# Patient Record
Sex: Female | Born: 1995 | Race: Black or African American | Hispanic: No | Marital: Single | Smoking: Never smoker
Health system: Southern US, Community
[De-identification: ages and names within clinical notes are randomized; demographics above are authoritative.]

## PROBLEM LIST (undated history)

## (undated) DIAGNOSIS — T7840XA Allergy, unspecified, initial encounter: Secondary | ICD-10-CM

## (undated) DIAGNOSIS — Z8489 Family history of other specified conditions: Secondary | ICD-10-CM

## (undated) DIAGNOSIS — R519 Headache, unspecified: Secondary | ICD-10-CM

## (undated) DIAGNOSIS — R51 Headache: Secondary | ICD-10-CM

## (undated) DIAGNOSIS — M21611 Bunion of right foot: Secondary | ICD-10-CM

## (undated) DIAGNOSIS — Z87442 Personal history of urinary calculi: Secondary | ICD-10-CM

## (undated) DIAGNOSIS — G43909 Migraine, unspecified, not intractable, without status migrainosus: Secondary | ICD-10-CM

## (undated) DIAGNOSIS — B019 Varicella without complication: Secondary | ICD-10-CM

## (undated) DIAGNOSIS — F419 Anxiety disorder, unspecified: Secondary | ICD-10-CM

## (undated) DIAGNOSIS — F329 Major depressive disorder, single episode, unspecified: Secondary | ICD-10-CM

## (undated) DIAGNOSIS — F32A Depression, unspecified: Secondary | ICD-10-CM

## (undated) HISTORY — DX: Major depressive disorder, single episode, unspecified: F32.9

## (undated) HISTORY — DX: Anxiety disorder, unspecified: F41.9

## (undated) HISTORY — DX: Allergy, unspecified, initial encounter: T78.40XA

## (undated) HISTORY — PX: SMALL INTESTINE SURGERY: SHX150

## (undated) HISTORY — DX: Depression, unspecified: F32.A

## (undated) HISTORY — DX: Headache, unspecified: R51.9

## (undated) HISTORY — DX: Varicella without complication: B01.9

## (undated) HISTORY — DX: Headache: R51

---

## 2015-04-02 DIAGNOSIS — Z975 Presence of (intrauterine) contraceptive device: Secondary | ICD-10-CM | POA: Insufficient documentation

## 2016-05-07 ENCOUNTER — Emergency Department (HOSPITAL_COMMUNITY): Payer: BC Managed Care – PPO

## 2016-05-07 ENCOUNTER — Encounter (HOSPITAL_COMMUNITY): Payer: Self-pay | Admitting: Radiology

## 2016-05-07 ENCOUNTER — Emergency Department (HOSPITAL_COMMUNITY)
Admission: EM | Admit: 2016-05-07 | Discharge: 2016-05-07 | Disposition: A | Discharge status: Home / Self Care | Payer: BC Managed Care – PPO | Attending: Emergency Medicine | Admitting: Emergency Medicine

## 2016-05-07 DIAGNOSIS — N1330 Unspecified hydronephrosis: Secondary | ICD-10-CM

## 2016-05-07 DIAGNOSIS — N132 Hydronephrosis with renal and ureteral calculous obstruction: Secondary | ICD-10-CM | POA: Diagnosis not present

## 2016-05-07 DIAGNOSIS — R109 Unspecified abdominal pain: Secondary | ICD-10-CM | POA: Diagnosis present

## 2016-05-07 DIAGNOSIS — N200 Calculus of kidney: Secondary | ICD-10-CM

## 2016-05-07 LAB — COMPREHENSIVE METABOLIC PANEL
ALT: 17 U/L (ref 14–54)
AST: 31 U/L (ref 15–41)
Albumin: 4.7 g/dL (ref 3.5–5.0)
Alkaline Phosphatase: 53 U/L (ref 38–126)
Anion gap: 12 (ref 5–15)
BILIRUBIN TOTAL: 0.4 mg/dL (ref 0.3–1.2)
BUN: 9 mg/dL (ref 6–20)
CALCIUM: 9.4 mg/dL (ref 8.9–10.3)
CO2: 24 mmol/L (ref 22–32)
CREATININE: 0.78 mg/dL (ref 0.44–1.00)
Chloride: 103 mmol/L (ref 101–111)
GFR calc Af Amer: >60 mL/min (ref >60)
Glucose, Bld: 96 mg/dL (ref 65–99)
Potassium: 3.2 mmol/L — ABNORMAL LOW (ref 3.5–5.1)
Sodium: 139 mmol/L (ref 135–145)
TOTAL PROTEIN: 7.5 g/dL (ref 6.5–8.1)

## 2016-05-07 LAB — URINALYSIS, ROUTINE W REFLEX MICROSCOPIC
BILIRUBIN URINE: NEGATIVE
GLUCOSE, UA: NEGATIVE mg/dL
Ketones, ur: NEGATIVE mg/dL
LEUKOCYTES UA: NEGATIVE
Nitrite: NEGATIVE
PH: 5 (ref 5.0–8.0)
Protein, ur: 100 mg/dL — AB
SPECIFIC GRAVITY, URINE: 1.02 (ref 1.005–1.030)

## 2016-05-07 LAB — CBC
HEMATOCRIT: 37 % (ref 36.0–46.0)
Hemoglobin: 12.7 g/dL (ref 12.0–15.0)
MCH: 30.2 pg (ref 26.0–34.0)
MCHC: 34.3 g/dL (ref 30.0–36.0)
MCV: 88.1 fL (ref 78.0–100.0)
Platelets: 230 10*3/uL (ref 150–400)
RBC: 4.2 MIL/uL (ref 3.87–5.11)
RDW: 12.4 % (ref 11.5–15.5)
WBC: 8 10*3/uL (ref 4.0–10.5)

## 2016-05-07 LAB — POC URINE PREG, ED: Preg Test, Ur: NEGATIVE

## 2016-05-07 MED ORDER — KETOROLAC TROMETHAMINE 30 MG/ML IJ SOLN
30.0000 mg | Freq: Once | INTRAMUSCULAR | Status: AC
  Administered 2016-05-07: 30 mg via INTRAVENOUS
  Filled 2016-05-07: qty 1

## 2016-05-07 MED ORDER — MORPHINE SULFATE (PF) 4 MG/ML IV SOLN
2.0000 mg | Freq: Once | INTRAVENOUS | Status: AC
  Administered 2016-05-07: 2 mg via INTRAVENOUS
  Filled 2016-05-07: qty 1

## 2016-05-07 MED ORDER — SODIUM CHLORIDE 0.9 % IV BOLUS (SEPSIS)
1000.0000 mL | Freq: Once | INTRAVENOUS | Status: AC
  Administered 2016-05-07: 1000 mL via INTRAVENOUS

## 2016-05-07 MED ORDER — POTASSIUM CHLORIDE CRYS ER 20 MEQ PO TBCR
40.0000 meq | EXTENDED_RELEASE_TABLET | Freq: Once | ORAL | Status: AC
  Administered 2016-05-07: 40 meq via ORAL
  Filled 2016-05-07: qty 2

## 2016-05-07 MED ORDER — IBUPROFEN 600 MG PO TABS: 600.0000 mg | ORAL_TABLET | Freq: Four times a day (QID) | ORAL | 0 refills | Status: DC | PRN

## 2016-05-07 MED ORDER — OXYCODONE-ACETAMINOPHEN 5-325 MG PO TABS: 1.0000 | ORAL_TABLET | Freq: Four times a day (QID) | ORAL | 0 refills | Status: DC | PRN

## 2016-05-07 MED ORDER — ONDANSETRON HCL 4 MG/2ML IJ SOLN
4.0000 mg | Freq: Once | INTRAMUSCULAR | Status: AC
  Administered 2016-05-07: 4 mg via INTRAVENOUS
  Filled 2016-05-07: qty 2

## 2016-05-07 NOTE — ED Notes (Signed)
Bladder Scan: 

## 2016-05-07 NOTE — ED Notes (Signed)
Patient transported to CT 

## 2016-05-07 NOTE — ED Provider Notes (Signed)
[facility]-EMERGENCY DEPT Provider Note   CSN: [id number] Arrival date & time: 05/07/16  4132     History   Chief Complaint Chief Complaint  Patient presents with  . Flank Pain  . Nausea  . Emesis    HPI Brenda Hutchinson is a 21 y.o. female.  HPI  21 y.o. female, presents to the Emergency Department today via EMS complaining of left sided flank pain x 2 days. Notes hx same with kidney infection. Pt states pain improved yesterday around 3pm, but returned this morning. Associated N/V x 1 this AM. Rates pain 9/10 on left flank. No diarrhea. No abdominal pain. No fevers. No dysuria. Able to tolerate PO. No CP/SOB. No meds PTA. No other symptoms noted.   No past medical history on file.  There are no active problems to display for this patient.   No past surgical history on file.  OB History    No data available       Home Medications    Prior to Admission medications   Not on File    Family History No family history on file.  Social History Social History  Substance Use Topics  . Smoking status: Not on file  . Smokeless tobacco: Not on file  . Alcohol use Not on file     Allergies   Patient has no allergy information on record.   Review of Systems Review of Systems ROS reviewed and all are negative for acute change except as noted in the HPI.  Physical Exam Updated Vital Signs BP 106/71   Pulse 89   Temp 98.5 F (36.9 C)   Resp 18   LMP 03/23/2016 (Approximate) Comment: irregular periods,neg preg 05-07-16  SpO2 97%   Physical Exam  Constitutional: She is oriented to person, place, and time. Vital signs are normal. She appears well-developed and well-nourished.  HENT:  Head: Normocephalic and atraumatic.  Right Ear: Hearing normal.  Left Ear: Hearing normal.  Eyes: Conjunctivae and EOM are normal. Pupils are equal, round, and reactive to light.  Neck: Normal range of motion. Neck supple.  Cardiovascular: Normal rate, regular rhythm, normal heart  sounds and intact distal pulses.   Pulmonary/Chest: Effort normal and breath sounds normal.  Abdominal: Soft. Bowel sounds are normal. There is no tenderness. There is CVA tenderness (left and right). There is no rigidity, no rebound, no guarding, no tenderness at McBurney's point and negative Murphy's sign.  Musculoskeletal: Normal range of motion.  Neurological: She is alert and oriented to person, place, and time.  Skin: Skin is warm and dry.  Psychiatric: She has a normal mood and affect. Her speech is normal and behavior is normal. Thought content normal.  Nursing note and vitals reviewed.  ED Treatments / Results  Labs (all labs ordered are listed, but only abnormal results are displayed) Labs Reviewed  URINALYSIS, ROUTINE W REFLEX MICROSCOPIC - Abnormal; Notable for the following:       Result Value   APPearance CLOUDY (*)    Hgb urine dipstick LARGE (*)    Protein, ur 100 (*)    Bacteria, UA RARE (*)    Squamous Epithelial / LPF 0-5 (*)    All other components within normal limits  COMPREHENSIVE METABOLIC PANEL - Abnormal; Notable for the following:    Potassium 3.2 (*)    All other components within normal limits  CBC  POC URINE PREG, ED   EKG  EKG Interpretation None      Radiology Ct Renal Stone Study  Result Date: 05/07/2016 CLINICAL DATA:  Left flank pain and dysuria.  Nausea and vomiting. EXAM: CT ABDOMEN AND PELVIS WITHOUT CONTRAST TECHNIQUE: Multidetector CT imaging of the abdomen and pelvis was performed following the standard protocol without oral or intravenous contrast material administration. COMPARISON:  None. FINDINGS: Lower chest: Lung bases are clear. Hepatobiliary: No focal liver lesions are apparent on this noncontrast enhanced study. Gallbladder wall is not appreciably thickened. There is no biliary duct dilatation. Pancreas: No pancreatic mass or inflammatory focus. Spleen: No splenic lesions are evident. Adrenals/Urinary Tract: Adrenals appear normal  bilaterally. There is no renal mass on either side. There is mild hydronephrosis on the left. There is no appreciable hydronephrosis on the right. There is minimal nephrocalcinosis, slightly greater on the right than on the left. There is no well-defined intrarenal calculus. There is a calculus at the left ureterovesical junction measuring 4 x 3 mm. On the right, there is a 3 x 2 mm calcification in the mid pelvis which appears to reside within the distal right ureter. Lack of fat surrounding the distal right ureter makes exact localization on the right somewhat difficult. Urinary bladder is midline with wall thickness within normal limits peer Stomach/Bowel: There is no appreciable bowel wall or mesenteric thickening. No bowel obstruction. No free air or portal venous air. Vascular/Lymphatic: There is no abdominal aortic aneurysm. No vascular lesion evident. No adenopathy is appreciable in the abdomen or pelvis. Reproductive: The uterus is anteverted. There is no pelvic mass. A minimal amount of free pelvic fluid may be physiologic. Other: Appendix appears normal. No abscess identified in the abdomen pelvis. No ascites beyond minimal free fluid in the pelvis. Musculoskeletal: There are no blastic or lytic bone lesions. No intramuscular or abdominal wall lesions. IMPRESSION: 4 x 3 mm calculus at the left ureterovesical junction with mild hydronephrosis on the left. There is a 3 x 2 mm calcification which is felt to reside in the distal right ureter without hydronephrosis appreciable on the right. Exact localization of this calcification on the right is somewhat difficult due to lack of hydronephrosis on the right and paucity of fat surrounding the right ureter. Minimal nephrocalcinosis. Minimal free fluid in the dependent portion the pelvis may be physiologic. No abscess. No bowel obstruction. Appendix appears normal. Electronically Signed   By: Bretta Bang III M.D.   On: 05/07/2016 08:11     Procedures Procedures (including critical care time)  Medications Ordered in ED Medications  morphine 4 MG/ML injection 2 mg (not administered)  sodium chloride 0.9 % bolus 1,000 mL (1,000 mLs Intravenous New Bag/Given 05/07/16 0740)  ketorolac (TORADOL) 30 MG/ML injection 30 mg (30 mg Intravenous Given 05/07/16 0740)  ondansetron (ZOFRAN) injection 4 mg (4 mg Intravenous Given 05/07/16 0740)     Initial Impression / Assessment and Plan / ED Course  I have reviewed the triage vital signs and the nursing notes.  Pertinent labs & imaging results that were available during my care of the patient were reviewed by me and considered in my medical decision making (see chart for details).  Final Clinical Impressions(s) / ED Diagnoses  {I have reviewed and evaluated the relevant laboratory values. {I have reviewed and evaluated the relevant imaging studies.  {I have reviewed the relevant previous healthcare records.  {I obtained HPI from historian.   ED Course:  Assessment: Pt is a 21 y.o. female who presents with left flank pain PTA. Noted symptoms x 2 days. Improved, but worsened this morning. Associated N/V x 1.  No abdominal pain. No fevers. On exam, pt in NAD. Nontoxic/nonseptic appearing. VSS. Afebrile. Lungs CTA. Heart RRR. Abdomen nontender soft. Left CVA tenderness noted. UA without signs of infection. CBC unremarkable. CMP with normal Creatinine function. CT Renal shows 4x3 mm left UVJ stone with mild hydronephrosis. Also 3x88mm distal right ureter stone without hydronephrosis. Given analgesia, zofran and fluids in ED. Consult to Urology (MacDiarmid). Plan is to DC home with analgesia and follow up to Urology. I have reviewed the [place] Controlled Substance Reporting System. Given Rx #10 percocet. At time of discharge, Patient is in no acute distress. Vital Signs are stable. Patient is able to ambulate. Patient able to tolerate PO.   Disposition/Plan:  DC Home Additional Verbal  discharge instructions given and discussed with patient.  Pt Instructed to f/u with Urology in the next week for evaluation and treatment of symptoms. Return precautions given Pt acknowledges and agrees with plan  Supervising Physician Benjiman Core, MD  Final diagnoses:  Right nephrolithiasis  Left nephrolithiasis  Hydronephrosis, left    New Prescriptions New Prescriptions   No medications on file     Audry Pili, PA-C 05/07/16 1610    Benjiman Core, MD 05/07/16 [phone]

## 2016-05-07 NOTE — Discharge Instructions (Signed)
Please read and follow all provided instructions.  Your diagnoses today include:  1. Right nephrolithiasis   2. Left nephrolithiasis   3. Hydronephrosis, left    Tests performed today include: Urine test that showed blood in your urine and no infection CT scan which showed a 3x73mm Right and 4x43mm Left sided kidney stone Blood test that showed normal kidney function Vital signs. See below for your results today.   Medications prescribed:   Take any prescribed medications only as directed.  Home care instructions:  Follow any educational materials contained in this packet.  Please double your fluid intake for the next several days. Strain your urine and save any stones that may pass.   BE VERY CAREFUL not to take multiple medicines containing Tylenol (also called acetaminophen). Doing so can lead to an overdose which can damage your liver and cause liver failure and possibly death.   Follow-up instructions: Please follow-up with your urologist or the urologist referral (provided on front page) in the next 1 week for further evaluation of your symptoms.  If you need to return to the Emergency Department, go to [facility] and not [facility]. The urologists are located at [facility] and can better care for you at this location.  Return instructions:  If you need to return to the Emergency Department, go to [facility] and not [facility]. The urologists are located at [facility] and can better care for you at this location.  Please return to the Emergency Department if you experience worsening symptoms.  Please return if you develop fever or uncontrolled pain or vomiting. Please return if you have any other emergent concerns.  Additional Information:  Your vital signs today were: BP (!) 98/53 (BP Location: Left Arm)    Pulse 64    Temp 98.5 F (36.9 C)    Resp 18    LMP 03/23/2016 (Approximate) Comment: irregular periods,neg preg 05-07-16   SpO2  100%  If your blood pressure (BP) was elevated above 135/85 this visit, please have this repeated by your doctor within one month. --------------

## 2016-05-07 NOTE — ED Notes (Signed)
Bed: WA16 Expected date:  Expected time:  Means of arrival:  Comments: Flank pain 

## 2016-05-07 NOTE — ED Triage Notes (Signed)
Patient from home BIB [facility] for left side flank and lower back pain that started yesterday. Patient woke this am with increased pain and unable to urinate. Pt also c/o nausea and vomiting. Vomited x2 with EMS in rout to hospital. Pt does have hx of kidney infection.

## 2016-10-28 ENCOUNTER — Ambulatory Visit: Payer: BC Managed Care – PPO | Admitting: Sports Medicine

## 2016-11-10 ENCOUNTER — Other Ambulatory Visit: Payer: Self-pay | Admitting: Orthopedic Surgery

## 2016-11-13 DIAGNOSIS — M21611 Bunion of right foot: Secondary | ICD-10-CM

## 2016-11-13 HISTORY — DX: Bunion of right foot: M21.611

## 2016-12-02 ENCOUNTER — Encounter (HOSPITAL_BASED_OUTPATIENT_CLINIC_OR_DEPARTMENT_OTHER): Payer: Self-pay | Admitting: *Deleted

## 2016-12-02 ENCOUNTER — Other Ambulatory Visit: Payer: Self-pay

## 2016-12-11 ENCOUNTER — Encounter (HOSPITAL_BASED_OUTPATIENT_CLINIC_OR_DEPARTMENT_OTHER)
Admission: RE | Disposition: A | Discharge status: Home / Self Care | Payer: Self-pay | Source: Ambulatory Visit | Attending: Orthopedic Surgery

## 2016-12-11 ENCOUNTER — Other Ambulatory Visit: Payer: Self-pay

## 2016-12-11 ENCOUNTER — Ambulatory Visit (HOSPITAL_BASED_OUTPATIENT_CLINIC_OR_DEPARTMENT_OTHER): Payer: 59 | Admitting: Anesthesiology

## 2016-12-11 ENCOUNTER — Encounter (HOSPITAL_BASED_OUTPATIENT_CLINIC_OR_DEPARTMENT_OTHER): Payer: Self-pay | Admitting: *Deleted

## 2016-12-11 ENCOUNTER — Ambulatory Visit (HOSPITAL_BASED_OUTPATIENT_CLINIC_OR_DEPARTMENT_OTHER)
Admission: RE | Admit: 2016-12-11 | Discharge: 2016-12-11 | Disposition: A | Discharge status: Home / Self Care | Payer: 59 | Source: Ambulatory Visit | Attending: Orthopedic Surgery | Admitting: Orthopedic Surgery

## 2016-12-11 DIAGNOSIS — M21611 Bunion of right foot: Secondary | ICD-10-CM | POA: Diagnosis present

## 2016-12-11 DIAGNOSIS — Z87442 Personal history of urinary calculi: Secondary | ICD-10-CM | POA: Diagnosis not present

## 2016-12-11 DIAGNOSIS — M2011 Hallux valgus (acquired), right foot: Secondary | ICD-10-CM | POA: Insufficient documentation

## 2016-12-11 HISTORY — DX: Bunion of right foot: M21.611

## 2016-12-11 HISTORY — DX: Migraine, unspecified, not intractable, without status migrainosus: G43.909

## 2016-12-11 HISTORY — DX: Family history of other specified conditions: Z84.89

## 2016-12-11 HISTORY — DX: Personal history of urinary calculi: Z87.442

## 2016-12-11 HISTORY — PX: BUNIONECTOMY: SHX129

## 2016-12-11 SURGERY — BUNIONECTOMY
Anesthesia: General | Site: Foot | Laterality: Right

## 2016-12-11 MED ORDER — ROPIVACAINE HCL 7.5 MG/ML IJ SOLN
INTRAMUSCULAR | Status: DC | PRN
  Administered 2016-12-11: 25 mL via PERINEURAL

## 2016-12-11 MED ORDER — BUPIVACAINE-EPINEPHRINE 0.5% -1:200000 IJ SOLN
INTRAMUSCULAR | Status: DC | PRN
  Administered 2016-12-11: 10 mL

## 2016-12-11 MED ORDER — SCOPOLAMINE 1 MG/3DAYS TD PT72: 1.0000 | MEDICATED_PATCH | Freq: Once | TRANSDERMAL | Status: DC | PRN

## 2016-12-11 MED ORDER — PROPOFOL 10 MG/ML IV BOLUS
INTRAVENOUS | Status: AC
  Filled 2016-12-11: qty 20

## 2016-12-11 MED ORDER — FENTANYL CITRATE (PF) 100 MCG/2ML IJ SOLN
INTRAMUSCULAR | Status: DC | PRN
  Administered 2016-12-11 (×4): 25 ug via INTRAVENOUS

## 2016-12-11 MED ORDER — FENTANYL CITRATE (PF) 100 MCG/2ML IJ SOLN
INTRAMUSCULAR | Status: AC
  Filled 2016-12-11: qty 2

## 2016-12-11 MED ORDER — PROPOFOL 10 MG/ML IV BOLUS
INTRAVENOUS | Status: DC | PRN
  Administered 2016-12-11: 200 mg via INTRAVENOUS
  Administered 2016-12-11: 50 mg via INTRAVENOUS

## 2016-12-11 MED ORDER — OXYCODONE HCL 5 MG PO TABS
ORAL_TABLET | ORAL | Status: AC
  Filled 2016-12-11: qty 1

## 2016-12-11 MED ORDER — LIDOCAINE 2% (20 MG/ML) 5 ML SYRINGE
INTRAMUSCULAR | Status: DC | PRN
  Administered 2016-12-11: 100 mg via INTRAVENOUS

## 2016-12-11 MED ORDER — OXYCODONE HCL 5 MG PO TABS
5.0000 mg | ORAL_TABLET | Freq: Once | ORAL | Status: AC | PRN
  Administered 2016-12-11: 5 mg via ORAL

## 2016-12-11 MED ORDER — CHLORHEXIDINE GLUCONATE 4 % EX LIQD: 60.0000 mL | Freq: Once | CUTANEOUS | Status: DC

## 2016-12-11 MED ORDER — OXYCODONE HCL 5 MG PO TABS: 5.0000 mg | ORAL_TABLET | ORAL | 0 refills | Status: DC | PRN

## 2016-12-11 MED ORDER — ONDANSETRON HCL 4 MG/2ML IJ SOLN
INTRAMUSCULAR | Status: DC | PRN
  Administered 2016-12-11: 4 mg via INTRAVENOUS

## 2016-12-11 MED ORDER — KETOROLAC TROMETHAMINE 30 MG/ML IJ SOLN
INTRAMUSCULAR | Status: AC
  Filled 2016-12-11: qty 1

## 2016-12-11 MED ORDER — DEXAMETHASONE SODIUM PHOSPHATE 10 MG/ML IJ SOLN
INTRAMUSCULAR | Status: AC
  Filled 2016-12-11: qty 1

## 2016-12-11 MED ORDER — FENTANYL CITRATE (PF) 100 MCG/2ML IJ SOLN
50.0000 ug | INTRAMUSCULAR | Status: DC | PRN
  Administered 2016-12-11: 50 ug via INTRAVENOUS

## 2016-12-11 MED ORDER — ONDANSETRON HCL 4 MG/2ML IJ SOLN
INTRAMUSCULAR | Status: AC
  Filled 2016-12-11: qty 2

## 2016-12-11 MED ORDER — SENNA 8.6 MG PO TABS: 2.0000 | ORAL_TABLET | Freq: Two times a day (BID) | ORAL | 0 refills | Status: DC

## 2016-12-11 MED ORDER — CEFAZOLIN SODIUM-DEXTROSE 1-4 GM/50ML-% IV SOLN
INTRAVENOUS | Status: AC
  Filled 2016-12-11: qty 50

## 2016-12-11 MED ORDER — MIDAZOLAM HCL 2 MG/2ML IJ SOLN
INTRAMUSCULAR | Status: AC
Start: 2016-12-11 — End: ?
  Filled 2016-12-11: qty 2

## 2016-12-11 MED ORDER — FENTANYL CITRATE (PF) 100 MCG/2ML IJ SOLN: 25.0000 ug | INTRAMUSCULAR | Status: DC | PRN

## 2016-12-11 MED ORDER — CEFAZOLIN SODIUM-DEXTROSE 2-4 GM/100ML-% IV SOLN
2.0000 g | INTRAVENOUS | Status: AC
  Administered 2016-12-11: 1 g via INTRAVENOUS

## 2016-12-11 MED ORDER — KETOROLAC TROMETHAMINE 30 MG/ML IJ SOLN
INTRAMUSCULAR | Status: DC | PRN
  Administered 2016-12-11: 30 mg via INTRAVENOUS

## 2016-12-11 MED ORDER — MEPERIDINE HCL 25 MG/ML IJ SOLN: 6.2500 mg | INTRAMUSCULAR | Status: DC | PRN

## 2016-12-11 MED ORDER — DEXAMETHASONE SODIUM PHOSPHATE 10 MG/ML IJ SOLN
INTRAMUSCULAR | Status: DC | PRN
  Administered 2016-12-11: 10 mg via INTRAVENOUS

## 2016-12-11 MED ORDER — DOCUSATE SODIUM 100 MG PO CAPS: 100.0000 mg | ORAL_CAPSULE | Freq: Two times a day (BID) | ORAL | 0 refills | Status: DC

## 2016-12-11 MED ORDER — LIDOCAINE 2% (20 MG/ML) 5 ML SYRINGE
INTRAMUSCULAR | Status: AC
  Filled 2016-12-11: qty 5

## 2016-12-11 MED ORDER — MIDAZOLAM HCL 2 MG/2ML IJ SOLN
1.0000 mg | INTRAMUSCULAR | Status: DC | PRN
  Administered 2016-12-11 (×2): 1 mg via INTRAVENOUS

## 2016-12-11 MED ORDER — LACTATED RINGERS IV SOLN
INTRAVENOUS | Status: DC
  Administered 2016-12-11 (×2): via INTRAVENOUS

## 2016-12-11 MED ORDER — 0.9 % SODIUM CHLORIDE (POUR BTL) OPTIME
TOPICAL | Status: DC | PRN
  Administered 2016-12-11: 200 mL

## 2016-12-11 MED ORDER — ASPIRIN EC 81 MG PO TBEC: 81.0000 mg | DELAYED_RELEASE_TABLET | Freq: Two times a day (BID) | ORAL | 0 refills | Status: DC

## 2016-12-11 MED ORDER — ONDANSETRON HCL 4 MG/2ML IJ SOLN: 4.0000 mg | Freq: Once | INTRAMUSCULAR | Status: DC | PRN

## 2016-12-11 SURGICAL SUPPLY — 73 items
BANDAGE ESMARK 6X9 LF (GAUZE/BANDAGES/DRESSINGS) IMPLANT
BIT DRILL 2.4 AO COUPLING CANN (BIT) ×2 IMPLANT
BLADE AVERAGE 25X9 (BLADE) IMPLANT
BLADE LONG MED 25X9 (BLADE) IMPLANT
BLADE MICRO SAGITTAL (BLADE) ×2 IMPLANT
BLADE MINI RND TIP GREEN BEAV (BLADE) IMPLANT
BLADE OSC/SAG .038X5.5 CUT EDG (BLADE) IMPLANT
BLADE SURG 15 STRL LF DISP TIS (BLADE) ×3 IMPLANT
BLADE SURG 15 STRL SS (BLADE) ×3
BNDG COHESIVE 4X5 TAN STRL (GAUZE/BANDAGES/DRESSINGS) ×2 IMPLANT
BNDG COHESIVE 6X5 TAN STRL LF (GAUZE/BANDAGES/DRESSINGS) ×2 IMPLANT
BNDG CONFORM 2 STRL LF (GAUZE/BANDAGES/DRESSINGS) ×2 IMPLANT
BNDG CONFORM 3 STRL LF (GAUZE/BANDAGES/DRESSINGS) ×2 IMPLANT
BNDG ESMARK 6X9 LF (GAUZE/BANDAGES/DRESSINGS)
CHLORAPREP W/TINT 26ML (MISCELLANEOUS) ×2 IMPLANT
COVER BACK TABLE 60X90IN (DRAPES) ×2 IMPLANT
CUFF TOURNIQUET SINGLE 24IN (TOURNIQUET CUFF) ×2 IMPLANT
CUFF TOURNIQUET SINGLE 34IN LL (TOURNIQUET CUFF) IMPLANT
DRAPE EXTREMITY T 121X128X90 (DRAPE) ×2 IMPLANT
DRAPE OEC MINIVIEW 54X84 (DRAPES) ×2 IMPLANT
DRAPE U-SHAPE 47X51 STRL (DRAPES) ×2 IMPLANT
DRSG MEPITEL 4X7.2 (GAUZE/BANDAGES/DRESSINGS) ×2 IMPLANT
DRSG PAD ABDOMINAL 8X10 ST (GAUZE/BANDAGES/DRESSINGS) ×2 IMPLANT
ELECT REM PT RETURN 9FT ADLT (ELECTROSURGICAL) ×2
ELECTRODE REM PT RTRN 9FT ADLT (ELECTROSURGICAL) ×1 IMPLANT
GAUZE SPONGE 4X4 12PLY STRL (GAUZE/BANDAGES/DRESSINGS) ×2 IMPLANT
GLOVE BIO SURGEON STRL SZ8 (GLOVE) ×2 IMPLANT
GLOVE BIOGEL PI IND STRL 7.0 (GLOVE) ×2 IMPLANT
GLOVE BIOGEL PI IND STRL 8 (GLOVE) ×2 IMPLANT
GLOVE BIOGEL PI INDICATOR 7.0 (GLOVE) ×2
GLOVE BIOGEL PI INDICATOR 8 (GLOVE) ×2
GLOVE ECLIPSE 8.0 STRL XLNG CF (GLOVE) ×2 IMPLANT
GOWN STRL REUS W/ TWL LRG LVL3 (GOWN DISPOSABLE) ×1 IMPLANT
GOWN STRL REUS W/ TWL XL LVL3 (GOWN DISPOSABLE) ×2 IMPLANT
GOWN STRL REUS W/TWL LRG LVL3 (GOWN DISPOSABLE) ×1
GOWN STRL REUS W/TWL XL LVL3 (GOWN DISPOSABLE) ×2
K-WIRE DBL END .054 LG (WIRE) ×2 IMPLANT
K-WIRE TROC 1.25X150 (WIRE) ×2
KWIRE TROC 1.25X150 (WIRE) ×1 IMPLANT
NEEDLE HYPO 22GX1.5 SAFETY (NEEDLE) ×2 IMPLANT
NEEDLE HYPO 25X1 1.5 SAFETY (NEEDLE) ×2 IMPLANT
NS IRRIG 1000ML POUR BTL (IV SOLUTION) ×2 IMPLANT
PACK BASIN DAY SURGERY FS (CUSTOM PROCEDURE TRAY) ×2 IMPLANT
PAD CAST 4YDX4 CTTN HI CHSV (CAST SUPPLIES) ×1 IMPLANT
PADDING CAST ABS 4INX4YD NS (CAST SUPPLIES)
PADDING CAST ABS COTTON 4X4 ST (CAST SUPPLIES) IMPLANT
PADDING CAST COTTON 4X4 STRL (CAST SUPPLIES) ×1
PADDING CAST COTTON 6X4 STRL (CAST SUPPLIES) ×2 IMPLANT
PENCIL BUTTON HOLSTER BLD 10FT (ELECTRODE) ×2 IMPLANT
SANITIZER HAND PURELL 535ML FO (MISCELLANEOUS) ×2 IMPLANT
SCREW CANN 4.0X38MM (Screw) ×2 IMPLANT
SCREW LP 3.5X38MM (Screw) ×2 IMPLANT
SHEET MEDIUM DRAPE 40X70 STRL (DRAPES) ×2 IMPLANT
SLEEVE SCD COMPRESS KNEE MED (MISCELLANEOUS) ×2 IMPLANT
SPLINT FAST PLASTER 5X30 (CAST SUPPLIES) ×20
SPLINT PLASTER CAST FAST 5X30 (CAST SUPPLIES) ×20 IMPLANT
SPONGE LAP 18X18 X RAY DECT (DISPOSABLE) ×2 IMPLANT
STOCKINETTE 6  STRL (DRAPES) ×1
STOCKINETTE 6 STRL (DRAPES) ×1 IMPLANT
SUCTION FRAZIER HANDLE 10FR (MISCELLANEOUS) ×1
SUCTION TUBE FRAZIER 10FR DISP (MISCELLANEOUS) ×1 IMPLANT
SUT ETHILON 3 0 PS 1 (SUTURE) ×2 IMPLANT
SUT MNCRL AB 3-0 PS2 18 (SUTURE) ×2 IMPLANT
SUT VIC AB 0 SH 27 (SUTURE) IMPLANT
SUT VIC AB 2-0 SH 27 (SUTURE) ×1
SUT VIC AB 2-0 SH 27XBRD (SUTURE) ×1 IMPLANT
SUT VICRYL 0 UR6 27IN ABS (SUTURE) IMPLANT
SYR BULB 3OZ (MISCELLANEOUS) ×2 IMPLANT
SYR CONTROL 10ML LL (SYRINGE) ×2 IMPLANT
TOWEL OR 17X24 6PK STRL BLUE (TOWEL DISPOSABLE) ×4 IMPLANT
TUBE CONNECTING 20X1/4 (TUBING) IMPLANT
UNDERPAD 30X30 (UNDERPADS AND DIAPERS) ×2 IMPLANT
YANKAUER SUCT BULB TIP NO VENT (SUCTIONS) IMPLANT

## 2016-12-11 NOTE — Anesthesia Preprocedure Evaluation (Signed)
Anesthesia Evaluation  Patient identified by MRN, date of birth, ID band Patient awake    Reviewed: Allergy & Precautions, NPO status , Patient's Chart, lab work & pertinent test results  Airway Mallampati: I  TM Distance: >3 FB Neck ROM: Full    Dental no notable dental hx.    Pulmonary neg pulmonary ROS,    Pulmonary exam normal breath sounds clear to auscultation       Cardiovascular negative cardio ROS Normal cardiovascular exam Rhythm:Regular Rate:Normal     Neuro/Psych  Headaches, negative neurological ROS  negative psych ROS   GI/Hepatic negative GI ROS, Neg liver ROS,   Endo/Other  negative endocrine ROS  Renal/GU negative Renal ROS  negative genitourinary   Musculoskeletal negative musculoskeletal ROS (+)   Abdominal   Peds negative pediatric ROS (+)  Hematology negative hematology ROS (+)   Anesthesia Other Findings   Reproductive/Obstetrics negative OB ROS                             Anesthesia Physical Anesthesia Plan  ASA: I  Anesthesia Plan: General   Post-op Pain Management: GA combined w/ Regional for post-op pain   Induction:   PONV Risk Score and Plan: 3 and Ondansetron, Dexamethasone, Treatment may vary due to age or medical condition and Midazolam  Airway Management Planned: Oral ETT and LMA  Additional Equipment:   Intra-op Plan:   Post-operative Plan: Extubation in OR  Informed Consent: I have reviewed the patients History and Physical, chart, labs and discussed the procedure including the risks, benefits and alternatives for the proposed anesthesia with the patient or authorized representative who has indicated his/her understanding and acceptance.   Dental advisory given  Plan Discussed with:   Anesthesia Plan Comments: (Discussed both nerve block for pain relief post-op and GA; including NV, sore throat, dental injury, and pulmonary  complications)        Anesthesia Quick Evaluation

## 2016-12-11 NOTE — Op Note (Signed)
12/11/2016  10:46 AM  PATIENT:  Brenda Hutchinson  21 y.o. female  PRE-OPERATIVE DIAGNOSIS:  Right Bunion  POST-OPERATIVE DIAGNOSIS:  Right Bunion  Procedure(s): 1.  Right modified McBride bunionectomy   2.  Right Lapidus 1st TMT joint arthrodesis   3.  Right AP and lateral xrays   SURGEON:  Toni ArthursJohn Samiya Mervin, MD  ASSISTANT: Alfredo MartinezJustin Ollis, PA-C  ANESTHESIA:   General, regional  EBL:  minimal   TOURNIQUET:   Total Tourniquet Time Documented: Thigh (Right) - 31 minutes Total: Thigh (Right) - 31 minutes  COMPLICATIONS:  None apparent  DISPOSITION:  Extubated, awake and stable to recovery.  INDICATION FOR PROCEDURE: The patient is a 21 year old female without significant past medical history.  She presents today for surgical treatment of a right painful bunion deformity.  She has failed nonoperative treatment to date including activity modification, oral anti-inflammatories and shoewear modification.  The risks and benefits of the alternative treatment options have been discussed in detail.  The patient wishes to proceed with surgery and specifically understands risks of bleeding, infection, nerve damage, blood clots, need for additional surgery, amputation and death.  PROCEDURE IN DETAIL:  After pre operative consent was obtained, and the correct operative site was identified, the patient was brought to the operating room and placed supine on the OR table.  Anesthesia was administered.  Pre-operative antibiotics were administered.  A surgical timeout was taken.  The right lower extremity was prepped and draped in standard sterile fashion with a tourniquet around the thigh.  The extremity was exsanguinated and the tourniquet was inflated to 250 mmHg.  A longitudinal incision was marked on the skin over the dorsum of the first webspace.  The incision was made and dissection was carried down through the subtendinous tissues.  The intermetatarsal ligament was divided under direct vision.  An  arthrotomy was then made between the lateral sesamoid and the first metatarsal head.  Small perforations were made in the lateral joint capsule.  The hallux could then be positioned in 20 degrees of varus passively.  Attention was turned to the medial aspect of the forefoot where a longitudinal incision was made over the hypertrophic medial eminence.  Dissection was carried down through the subcutaneous tissues and the medial joint capsule was incised.  It was elevated plantarly and dorsally.  The medial eminence was then resected in line with the first metatarsal shaft with the oscillating saw.  Attention was then turned to the dorsum of the midfoot where a longitudinal incision was made over the first tarsometatarsal joint.  Dissection was carried down through the skin and subtendinous tissues.  The interval between the extensor hallucis longus and brevis tendons was developed.  The joint capsule was incised and elevated medially and laterally.  The oscillating saw was then used to resect the articular cartilage and subchondral bone from both sides of the joint.  The cuts were beveled to resect more bone laterally than medially in order to correct the intermetatarsal angle.  The wound was irrigated copiously.  Both sides of the joint were perforated with a 2.5 mm drill bit leaving the resultant bone graft in place.  The joint was reduced and provisionally pinned.  An AP radiograph showed appropriate correction of the intermetatarsal angle.  The guidepin was overdrilled and a 4 mm Biomet partially threaded cannulated screw was inserted.  It was noted to compress the joint appropriately.  A K wire was then inserted from distal to proximal.  It was overdrilled and a 3.5  mm Biomet LPS screw was inserted.    Final AP and lateral radiographs confirmed appropriate correction of the hallux valgus and intermetatarsal angles.  Hardware was appropriately positioned of the appropriate lengths.  All 3 wounds were  irrigated copiously.  The medial joint capsule was repaired with imbricating sutures of 2-0 Vicryl.  Subcutaneous tissues were approximated with Monocryl.  Skin incisions were closed with nylon.  Sterile dressings were applied followed by a bunion wrap and a well-padded short leg splint.  Tourniquet was released after application of the dressings at 31 minutes.  The patient was awakened from anesthesia and transported to the recovery room in stable condition.  FOLLOW UP PLAN: The patient will be nonweightbearing on the right lower extremity in a short leg splint.  Follow-up with me in the office in 2 weeks for suture removal.  Aspirin 81 mg p.o. twice daily for DVT prophylaxis.  RADIOGRAPHS: AP and lateral radiographs of the right foot are obtained intraoperatively.  These show interval arthrodesis of the first tarsometatarsal joint and resection of the bunion deformity with appropriate correction of hallux valgus and intermetatarsal angles.  Hardware is appropriately positioned and of the appropriate lengths.    Alfredo MartinezJustin Ollis PA-C was present and scrubbed for the duration of the operative case. His assistance assistance was essential in positioning the patient, prepping and draping, gaining maintaining exposure, performing the operation, closing and dressing the wounds and applying the splint.

## 2016-12-11 NOTE — Progress Notes (Signed)
Assisted Dr. Oddono with left, ultrasound guided, adductor canal block. Side rails up, monitors on throughout procedure. See vital signs in flow sheet. Tolerated Procedure well.  

## 2016-12-11 NOTE — Anesthesia Postprocedure Evaluation (Signed)
Anesthesia Post Note  Patient: Brenda Hutchinson  Procedure(s) Performed: Right Lapidus and Modified McBride Bunionectomy (Right Foot)     Patient location during evaluation: PACU Anesthesia Type: General Level of consciousness: awake and alert Pain management: pain level controlled Vital Signs Assessment: post-procedure vital signs reviewed and stable Respiratory status: spontaneous breathing, nonlabored ventilation, respiratory function stable and patient connected to nasal cannula oxygen Cardiovascular status: blood pressure returned to baseline and stable Postop Assessment: no apparent nausea or vomiting Anesthetic complications: no    Last Vitals:  Vitals:   12/11/16 1120 12/11/16 1154  BP:  116/77  Pulse: 70 64  Resp: 12   Temp:  37.1 C  SpO2: 100% 100%    Last Pain:  Vitals:   12/11/16 1154  TempSrc: Oral  PainSc: 3                  Jerritt Cardoza

## 2016-12-11 NOTE — Anesthesia Procedure Notes (Signed)
Anesthesia Regional Block: Adductor canal block   Pre-Anesthetic Checklist: ,, timeout performed, Correct Patient, Correct Site, Correct Laterality, Correct Procedure, Correct Position, site marked, Risks and benefits discussed,  Surgical consent,  Pre-op evaluation,  At surgeon's request and post-op pain management  Laterality: Right  Prep: chloraprep       Needles:  Injection technique: Single-shot  Needle Type: Echogenic Stimulator Needle     Needle Length: 5cm  Needle Gauge: 22     Additional Needles:   Procedures:, nerve stimulator,,, ultrasound used (permanent image in chart),,,,  Narrative:  Start time: 12/11/2016 8:15 AM End time: 12/11/2016 8:20 AM Injection made incrementally with aspirations every 5 mL.  Performed by: Personally  Anesthesiologist: Bethena Midgetddono, Lakelyn Straus, MD  Additional Notes: Functioning IV was confirmed and monitors were applied.  A 50mm 22ga Arrow echogenic stimulator needle was used. Sterile prep and drape,hand hygiene and sterile gloves were used. Ultrasound guidance: relevant anatomy identified, needle position confirmed, local anesthetic spread visualized around nerve(s)., vascular puncture avoided.  Image printed for medical record. Negative aspiration and negative test dose prior to incremental administration of local anesthetic. The patient tolerated the procedure well.

## 2016-12-11 NOTE — Transfer of Care (Signed)
Immediate Anesthesia Transfer of Care Note  Patient: Brenda Hutchinson  Procedure(s) Performed: Right Lapidus and Modified McBride Bunionectomy (Right Foot)  Patient Location: PACU  Anesthesia Type:GA combined with regional for post-op pain  Level of Consciousness: awake, alert  and oriented  Airway & Oxygen Therapy: Patient Spontanous Breathing and Patient connected to face mask oxygen  Post-op Assessment: Report given to RN and Post -op Vital signs reviewed and stable  Post vital signs: Reviewed and stable  Last Vitals:  Vitals:   12/11/16 0819 12/11/16 0820  BP:    Pulse: 80 82  Resp: 20 15  Temp:    SpO2: 100% 100%    Last Pain:  Vitals:   12/11/16 0736  TempSrc: Oral  PainSc:          Complications: No apparent anesthesia complications

## 2016-12-11 NOTE — Anesthesia Procedure Notes (Signed)
Procedure Name: LMA Insertion Date/Time: 12/11/2016 9:52 AM Performed by: Pearson Grippeobertson, Ginette Bradway M, CRNA Pre-anesthesia Checklist: Patient identified, Emergency Drugs available, Suction available and Patient being monitored Patient Re-evaluated:Patient Re-evaluated prior to induction Oxygen Delivery Method: Circle system utilized Preoxygenation: Pre-oxygenation with 100% oxygen Induction Type: IV induction Ventilation: Mask ventilation without difficulty LMA: LMA inserted LMA Size: 3.0 Number of attempts: 1 Airway Equipment and Method: Bite block Placement Confirmation: positive ETCO2 Tube secured with: Tape Dental Injury: Teeth and Oropharynx as per pre-operative assessment

## 2016-12-11 NOTE — Discharge Instructions (Addendum)
Brenda Hewitt, MD °[facility] ° °Please read the following information regarding your care after surgery. ° °Medications  °You only need a prescription for the narcotic pain medicine (ex. oxycodone, Percocet, Norco).  All of the other medicines listed below are available over the counter. °X Aleve 2 pills twice a day for the first 3 days after surgery. °X acetominophen (Tylenol) 650 mg every 4-6 hours as you need for minor to moderate pain °X oxycodone as prescribed for severe pain ° °Narcotic pain medicine (ex. oxycodone, Percocet, Vicodin) will cause constipation.  To prevent this problem, take the following medicines while you are taking any pain medicine. °X docusate sodium (Colace) 100 mg twice a day X senna (Senokot) 2 tablets twice a day ° °X To help prevent blood clots, take a baby aspirin (81 mg) twice a day for two weeks after surgery.  You should also get up every hour while you are awake to move around.   ° °Weight Bearing °X Do not bear any weight on the operated leg or foot. ° °Cast / Splint / Dressing °X Keep your splint, cast or dressing clean and dry.  Don’t put anything (coat hanger, pencil, etc) down inside of it.  If it gets damp, use a hair dryer on the cool setting to dry it.  If it gets soaked, call the office to schedule an appointment for a cast change. ° ° °After your dressing, cast or splint is removed; you may shower, but do not soak or scrub the wound.  Allow the water to run over it, and then gently pat it dry. ° °Swelling °It is normal for you to have swelling where you had surgery.  To reduce swelling and pain, keep your toes above your nose for at least 3 days after surgery.  It may be necessary to keep your foot or leg elevated for several weeks.  If it hurts, it should be elevated. ° °Follow Up °Call my office at [phone] when you are discharged from the hospital or surgery center to schedule an appointment to be seen two weeks after surgery. ° °Call my office at  [phone] if you develop a fever >101.5° F, nausea, vomiting, bleeding from the surgical site or severe pain.   ° ° °Post Anesthesia Home Care Instructions ° °Activity: °Get plenty of rest for the remainder of the day. A responsible individual must stay with you for 24 hours following the procedure.  °For the next 24 hours, DO NOT: °-Drive a car °-Operate machinery °-Drink alcoholic beverages °-Take any medication unless instructed by your physician °-Make any legal decisions or sign important papers. ° °Meals: °Start with liquid foods such as gelatin or soup. Progress to regular foods as tolerated. Avoid greasy, spicy, heavy foods. If nausea and/or vomiting occur, drink only clear liquids until the nausea and/or vomiting subsides. Call your physician if vomiting continues. ° °Special Instructions/Symptoms: °Your throat may feel dry or sore from the anesthesia or the breathing tube placed in your throat during surgery. If this causes discomfort, gargle with warm salt water. The discomfort should disappear within 24 hours. ° °If you had a scopolamine patch placed behind your ear for the management of post- operative nausea and/or vomiting: ° °1. The medication in the patch is effective for 72 hours, after which it should be removed.  Wrap patch in a tissue and discard in the trash. Wash hands thoroughly with soap and water. °2. You may remove the patch earlier than 72 hours if you   experience unpleasant side effects which may include dry mouth, dizziness or visual disturbances. °3. Avoid touching the patch. Wash your hands with soap and water after contact with the patch. °  °Regional Anesthesia Blocks ° °1. Numbness or the inability to move the "blocked" extremity may last from 3-48 hours after placement. The length of time depends on the medication injected and your individual response to the medication. If the numbness is not going away after 48 hours, call your surgeon. ° °2. The extremity that is blocked will  need to be protected until the numbness is gone and the  Strength has returned. Because you cannot feel it, you will need to take extra care to avoid injury. Because it may be weak, you may have difficulty moving it or using it. You may not know what position it is in without looking at it while the block is in effect. ° °3. For blocks in the legs and feet, returning to weight bearing and walking needs to be done carefully. You will need to wait until the numbness is entirely gone and the strength has returned. You should be able to move your leg and foot normally before you try and bear weight or walk. You will need someone to be with you when you first try to ensure you do not fall and possibly risk injury. ° °4. Bruising and tenderness at the needle site are common side effects and will resolve in a few days. ° °5. Persistent numbness or new problems with movement should be communicated to the surgeon or the [facility] ([phone])/ [facility] ([phone]). °

## 2016-12-11 NOTE — H&P (Signed)
Brenda Hutchinson is an 21 y.o. female.   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  She has failed non op treatment and presents today for lapidus bunion arthrodesis of the 1st TMT joint and modified McBride bunionectomy.  Past Medical History:  Diagnosis Date  . Bunion of right foot 11/2016  . Family history of adverse reaction to anesthesia    pt's son has hx. of being hard to wake up post-op  . History of kidney stones   . Migraines     Past Surgical History:  Procedure Laterality Date  . CESAREAN SECTION      Family History  Problem Relation Age of Onset  . Anesthesia problems Son        hard to wake up post-op   Social History:  reports that  has never smoked. she has never used smokeless tobacco. She reports that she drinks alcohol. She reports that she does not use drugs.  Allergies: No Known Allergies  Medications Prior to Admission  Medication Sig Dispense Refill  . etonogestrel (NEXPLANON) 68 MG IMPL implant 1 each by Subdermal route once.      No results found for this or any previous visit (from the past 48 hour(s)). No results found.  ROS  No recent f/c/n/v/wt loss  Blood pressure (!) 113/58, pulse 96, temperature 98.5 F (36.9 C), temperature source Oral, resp. rate 18, height 5' (1.524 m), weight 38.6 kg (85 lb), last menstrual period 12/10/2016, SpO2 100 %. Physical Exam  wn wd female in nad.  A and o x 4.  Mood and affect normal.  EOMI.  resp unlabored.  R foot with healthy skin.  No lymphadenopathy.  5/5 strength in PF and DF of the ankle and toes.  Sens to LT intact at the forefoot.  Pulses are palpable in the foot.  Assessment/Plan R bunion - to OR for surgical correction.  The risks and benefits of the alternative treatment options have been discussed in detail.  The patient wishes to proceed with surgery and specifically understands risks of bleeding, infection, nerve damage, blood  clots, need for additional surgery, amputation and death.   Brenda Hutchinson, Brenda Heimann, MD 12/11/2016, 7:40 AM

## 2016-12-12 ENCOUNTER — Encounter (HOSPITAL_BASED_OUTPATIENT_CLINIC_OR_DEPARTMENT_OTHER): Payer: Self-pay | Admitting: Orthopedic Surgery

## 2016-12-17 DIAGNOSIS — R87613 High grade squamous intraepithelial lesion on cytologic smear of cervix (HGSIL): Secondary | ICD-10-CM | POA: Insufficient documentation

## 2017-02-18 DIAGNOSIS — M2010 Hallux valgus (acquired), unspecified foot: Secondary | ICD-10-CM | POA: Insufficient documentation

## 2017-08-26 IMAGING — CT CT RENAL STONE PROTOCOL
2 of 3 series · 16 of 46 positions shown, 18 images · non-contrast
Comparison: None.

CLINICAL DATA: Left flank pain and dysuria.  Nausea and vomiting.

EXAM:
CT ABDOMEN AND PELVIS WITHOUT CONTRAST
TECHNIQUE: Multidetector CT imaging of the abdomen and pelvis was performed
following the standard protocol without oral or intravenous contrast
material administration.

[Series 4: lung · axial · 0.56mm/px · z∈[+1438,+1502]mm · 13 of 38 slices shown, 15 images]
[im 3/38  soft-tissue]
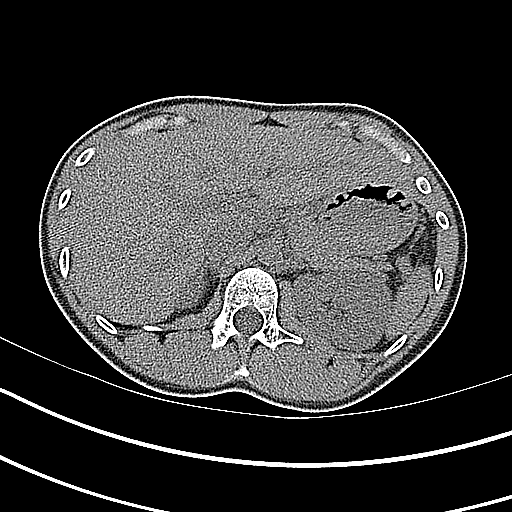
[im 3/38  bone]
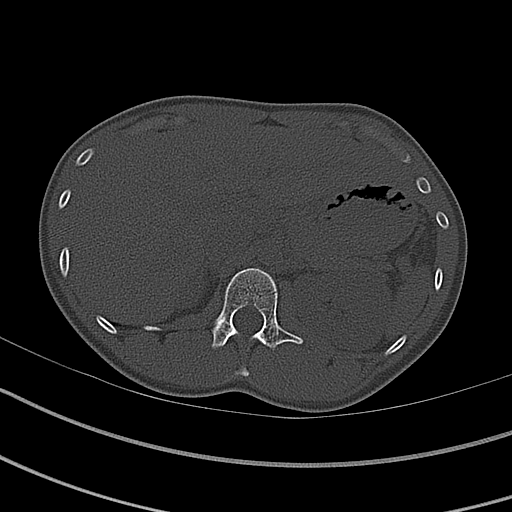
[im 5/38  soft-tissue]
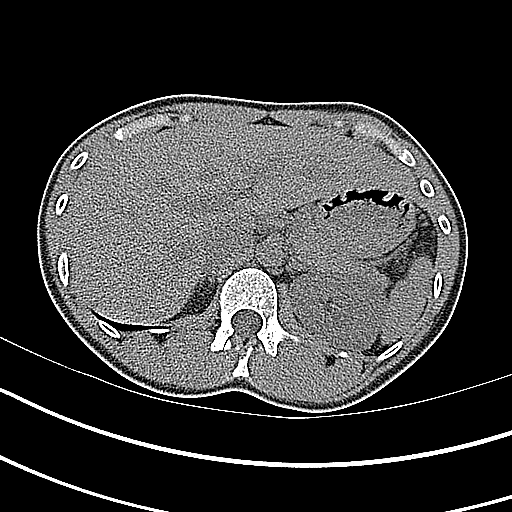
[im 8/38  soft-tissue]
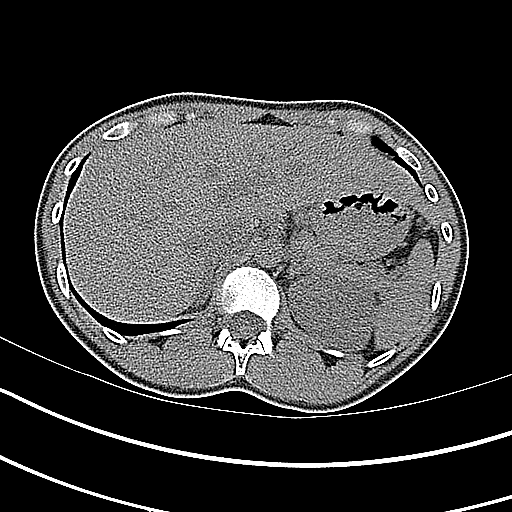
[im 11/38  soft-tissue]
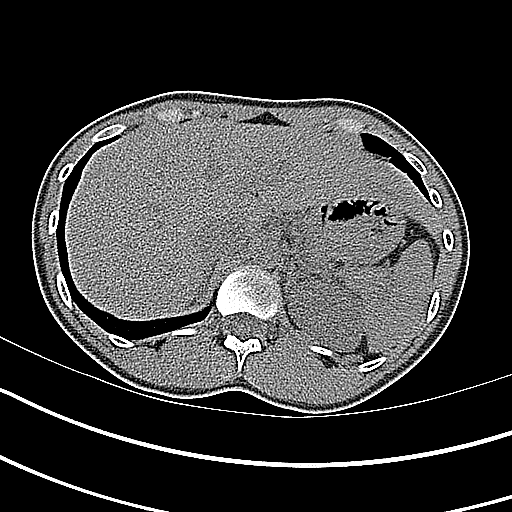
[im 14/38  soft-tissue]
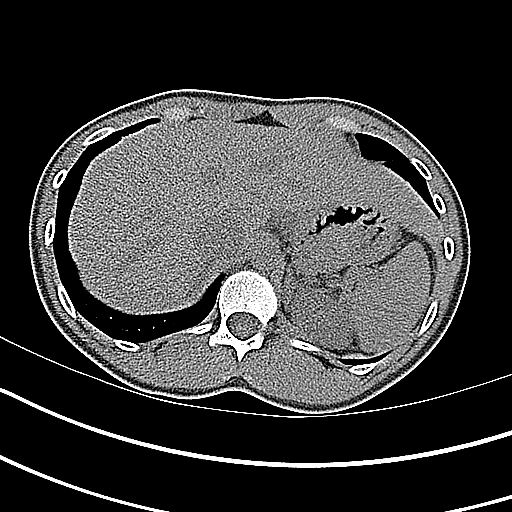
[im 16/38  soft-tissue]
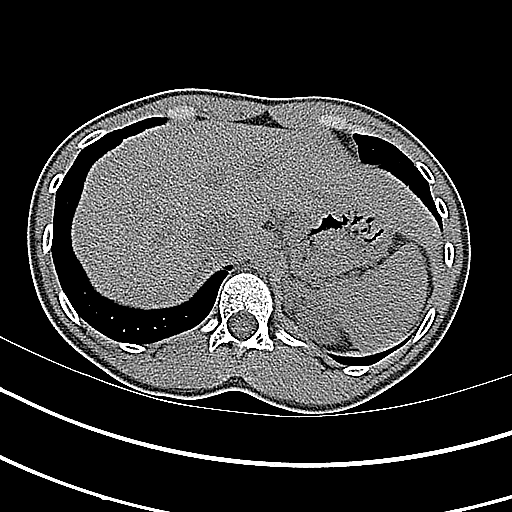
[im 20/38  soft-tissue]
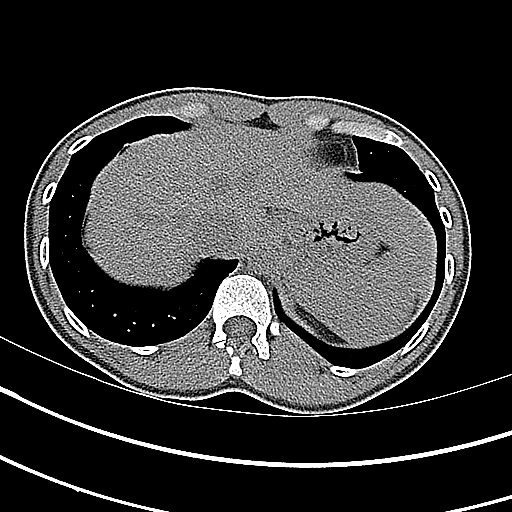
[im 22/38  soft-tissue]
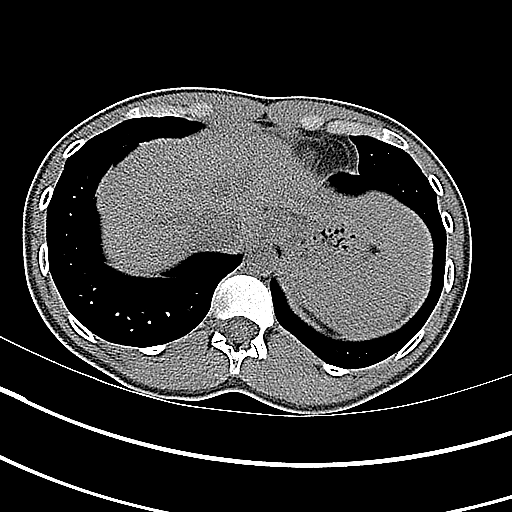
[im 24/38  soft-tissue]
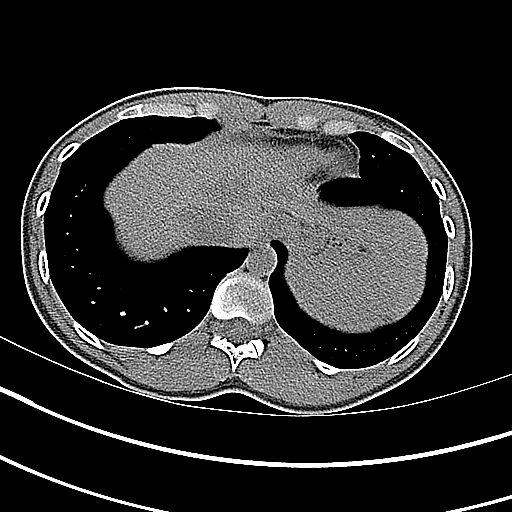
[im 24/38  bone]
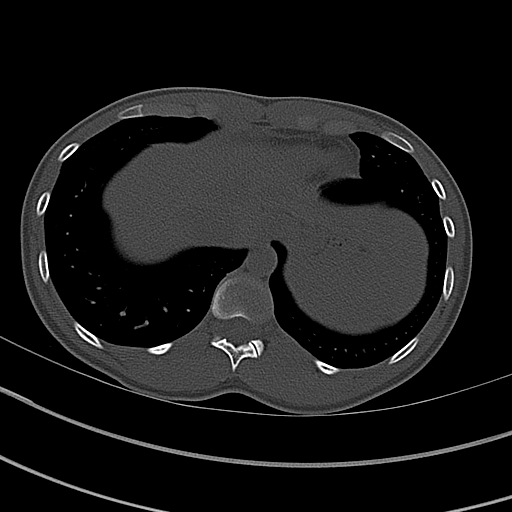
[im 27/38  soft-tissue]
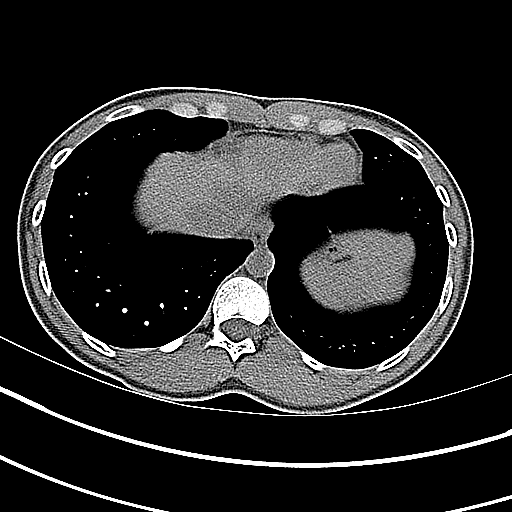
[im 30/38  soft-tissue]
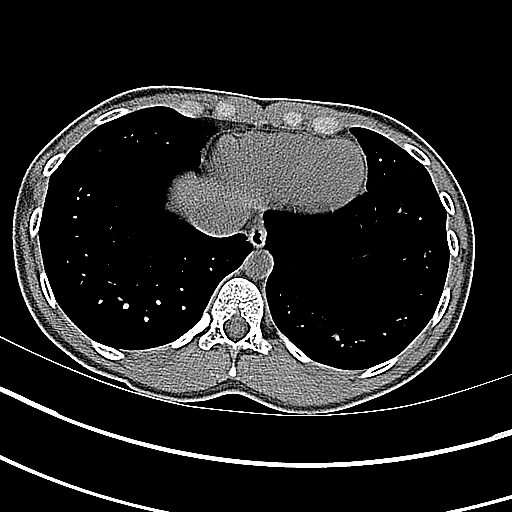
[im 33/38  soft-tissue]
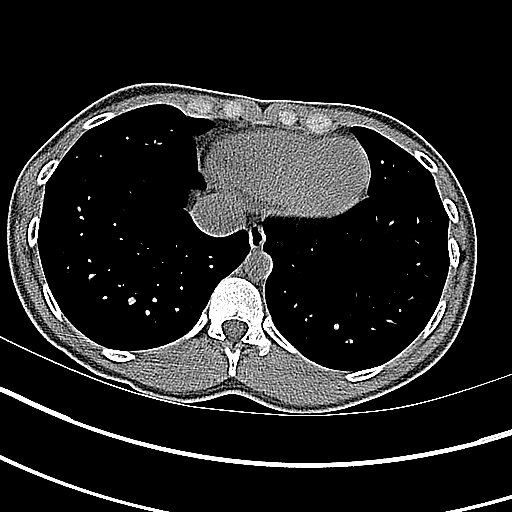
[im 35/38  soft-tissue]
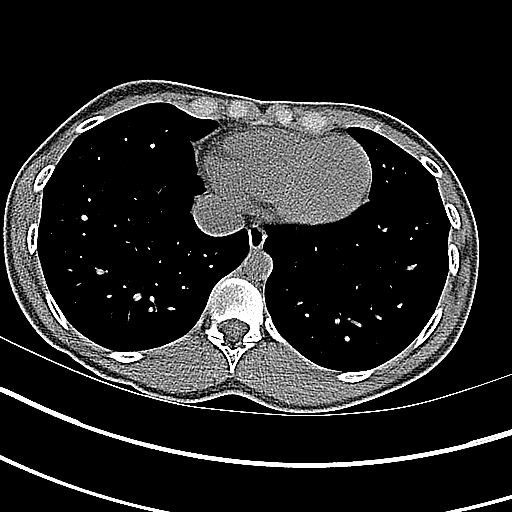

[Series 5: coronal · coronal · 0.63mm/px · 3 of 102 slices shown]
[im 34/102  soft-tissue]
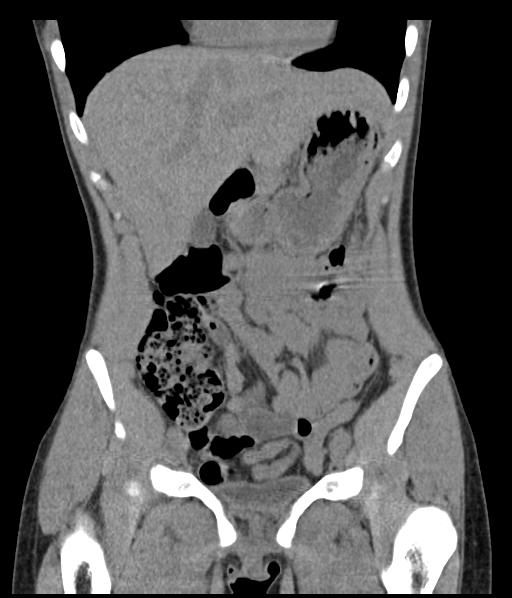
[im 45/102  soft-tissue]
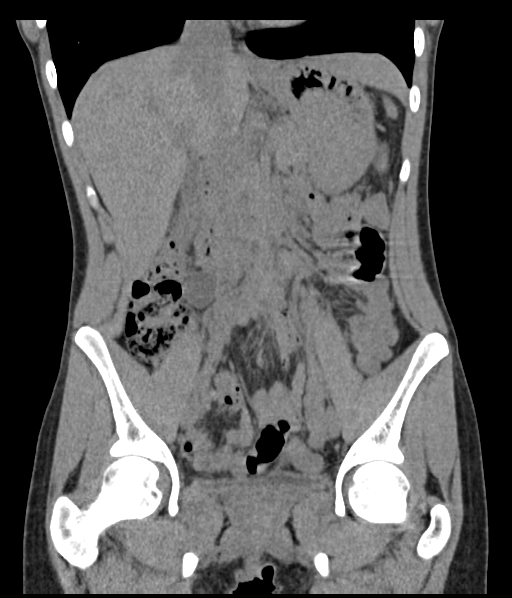
[im 57/102  soft-tissue]
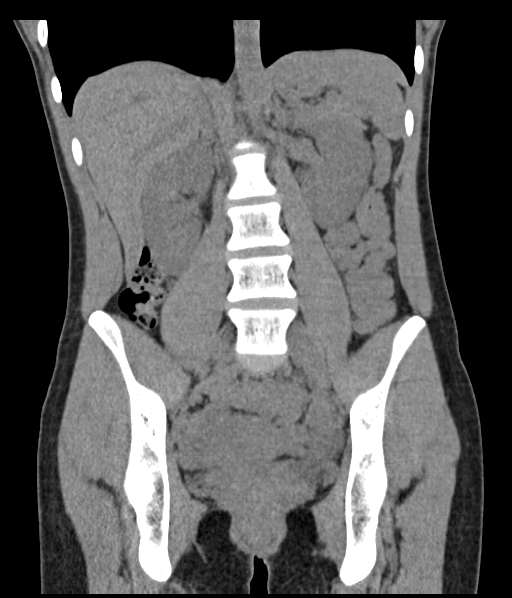

[16 of 46 positions shown; findings below may reference images not displayed]

FINDINGS: Lower chest: Lung bases are clear.

Hepatobiliary: No focal liver lesions are apparent on this
noncontrast enhanced study. Gallbladder wall is not appreciably
thickened. There is no biliary duct dilatation.

Pancreas: No pancreatic mass or inflammatory focus.

Spleen: No splenic lesions are evident.

Adrenals/Urinary Tract: Adrenals appear normal bilaterally. There is
no renal mass on either side. There is mild hydronephrosis on the
left. There is no appreciable hydronephrosis on the right. There is
minimal nephrocalcinosis, slightly greater on the right than on the
left. There is no well-defined intrarenal calculus. There is a
calculus at the left ureterovesical junction measuring 4 x 3 mm. On
the right, there is a 3 x 2 mm calcification in the mid pelvis which
appears to reside within the distal right ureter. Lack of fat
surrounding the distal right ureter makes exact localization on the
right somewhat difficult. Urinary bladder is midline with wall
thickness within normal limits peer

Stomach/Bowel: There is no appreciable bowel wall or mesenteric
thickening. No bowel obstruction. No free air or portal venous air.

Vascular/Lymphatic: There is no abdominal aortic aneurysm. No
vascular lesion evident. No adenopathy is appreciable in the abdomen
or pelvis.

Reproductive: The uterus is anteverted. There is no pelvic mass. A
minimal amount of free pelvic fluid may be physiologic.

Other: Appendix appears normal. No abscess identified in the abdomen
pelvis. No ascites beyond minimal free fluid in the pelvis.

Musculoskeletal: There are no blastic or lytic bone lesions. No
intramuscular or abdominal wall lesions.
IMPRESSION: 4 x 3 mm calculus at the left ureterovesical junction with mild
hydronephrosis on the left. There is a 3 x 2 mm calcification which
is felt to reside in the distal right ureter without hydronephrosis
appreciable on the right. Exact localization of this calcification
on the right is somewhat difficult due to lack of hydronephrosis on
the right and paucity of fat surrounding the right ureter.

Minimal nephrocalcinosis.

Minimal free fluid in the dependent portion the pelvis may be
physiologic. No abscess. No bowel obstruction. Appendix appears
normal.

## 2018-01-12 ENCOUNTER — Encounter: Payer: Self-pay | Admitting: Certified Nurse Midwife

## 2018-01-14 ENCOUNTER — Telehealth: Payer: Self-pay

## 2018-01-14 NOTE — Telephone Encounter (Signed)
Copied from CRM [id number]. Topic: Appointment Scheduling - New Patient >> Jan 14, 2018  3:13 PM Lorayne Bender wrote: New patient has been scheduled for your office. Provider: Dr. Salomon Fick Date of Appointment: 01/28/2018  Route to department's PEC pool.

## 2018-01-28 ENCOUNTER — Encounter: Payer: Self-pay | Admitting: Family Medicine

## 2018-01-28 ENCOUNTER — Encounter: Payer: BC Managed Care – PPO | Admitting: Certified Nurse Midwife

## 2018-01-28 ENCOUNTER — Ambulatory Visit: Payer: BC Managed Care – PPO | Admitting: Certified Nurse Midwife

## 2018-01-28 ENCOUNTER — Ambulatory Visit (INDEPENDENT_AMBULATORY_CARE_PROVIDER_SITE_OTHER): Payer: BC Managed Care – PPO | Admitting: Family Medicine

## 2018-01-28 VITALS — BP 98/64 | HR 88 | Temp 98.1°F | Ht 60.0 in | Wt 89.0 lb

## 2018-01-28 DIAGNOSIS — M21619 Bunion of unspecified foot: Secondary | ICD-10-CM | POA: Diagnosis not present

## 2018-01-28 DIAGNOSIS — F329 Major depressive disorder, single episode, unspecified: Secondary | ICD-10-CM

## 2018-01-28 DIAGNOSIS — Z975 Presence of (intrauterine) contraceptive device: Secondary | ICD-10-CM | POA: Diagnosis not present

## 2018-01-28 DIAGNOSIS — F32A Depression, unspecified: Secondary | ICD-10-CM

## 2018-01-28 DIAGNOSIS — Z8669 Personal history of other diseases of the nervous system and sense organs: Secondary | ICD-10-CM

## 2018-01-28 DIAGNOSIS — F419 Anxiety disorder, unspecified: Secondary | ICD-10-CM | POA: Diagnosis not present

## 2018-01-28 DIAGNOSIS — Z7689 Persons encountering health services in other specified circumstances: Secondary | ICD-10-CM

## 2018-01-28 DIAGNOSIS — R87613 High grade squamous intraepithelial lesion on cytologic smear of cervix (HGSIL): Secondary | ICD-10-CM

## 2018-01-28 NOTE — Patient Instructions (Signed)
Migraine Headache A migraine headache is an intense, throbbing pain on one side or both sides of the head. Migraines may also cause other symptoms, such as nausea, vomiting, and sensitivity to light and noise. What are the causes? Doing or taking certain things may also trigger migraines, such as:  Alcohol.  Smoking.  Medicines, such as: ? Medicine used to treat chest pain (nitroglycerine). ? Birth control pills. ? Estrogen pills. ? Certain blood pressure medicines.  Aged cheeses, chocolate, or caffeine.  Foods or drinks that contain nitrates, glutamate, aspartame, or tyramine.  Physical activity. Other things that may trigger a migraine include:  Menstruation.  Pregnancy.  Hunger.  Stress, lack of sleep, too much sleep, or fatigue.  Weather changes. What increases the risk? The following factors may make you more likely to experience migraine headaches:  Age. Risk increases with age.  Family history of migraine headaches.  Being Caucasian.  Depression and anxiety.  Obesity.  Being a woman.  Having a hole in the heart (patent foramen ovale) or other heart problems. What are the signs or symptoms? The main symptom of this condition is pulsating or throbbing pain. Pain may:  Happen in any area of the head, such as on one side or both sides.  Interfere with daily activities.  Get worse with physical activity.  Get worse with exposure to bright lights or loud noises. Other symptoms may include:  Nausea.  Vomiting.  Dizziness.  General sensitivity to bright lights, loud noises, or smells. Before you get a migraine, you may get warning signs that a migraine is developing (aura). An aura may include:  Seeing flashing lights or having blind spots.  Seeing bright spots, halos, or zigzag lines.  Having tunnel vision or blurred vision.  Having numbness or a tingling feeling.  Having trouble talking.  Having muscle weakness. How is this diagnosed? A  migraine headache can be diagnosed based on:  Your symptoms.  A physical exam.  Tests, such as CT scan or MRI of the head. These imaging tests can help rule out other causes of headaches.  Taking fluid from the spine (lumbar puncture) and analyzing it (cerebrospinal fluid analysis, or CSF analysis). How is this treated? A migraine headache is usually treated with medicines that:  Relieve pain.  Relieve nausea.  Prevent migraines from coming back. Treatment may also include:  Acupuncture.  Lifestyle changes like avoiding foods that trigger migraines. Follow these instructions at home: Medicines  Take over-the-counter and prescription medicines only as told by your health care provider.  Do not drive or use heavy machinery while taking prescription pain medicine.  To prevent or treat constipation while you are taking prescription pain medicine, your health care provider may recommend that you: ? Drink enough fluid to keep your urine clear or pale yellow. ? Take over-the-counter or prescription medicines. ? Eat foods that are high in fiber, such as fresh fruits and vegetables, whole grains, and beans. ? Limit foods that are high in fat and processed sugars, such as fried and sweet foods. Lifestyle  Avoid alcohol use.  Do not use any products that contain nicotine or tobacco, such as cigarettes and e-cigarettes. If you need help quitting, ask your health care provider.  Get at least 8 hours of sleep every night.  Limit your stress. General instructions      Keep a journal to find out what may trigger your migraine headaches. For example, write down: ? What you eat and drink. ? How much sleep you  get. ? Any change to your diet or medicines.  If you have a migraine: ? Avoid things that make your symptoms worse, such as bright lights. ? It may help to lie down in a dark, quiet room. ? Do not drive or use heavy machinery. ? Ask your health care provider what  activities are safe for you while you are experiencing symptoms.  Keep all follow-up visits as told by your health care provider. This is important. Contact a health care provider if:  You develop symptoms that are different or more severe than your usual migraine symptoms. Get help right away if:  Your migraine becomes severe.  You have a fever.  You have a stiff neck.  You have vision loss.  Your muscles feel weak or like you cannot control them.  You start to lose your balance often.  You develop trouble walking.  You faint. This information is not intended to replace advice given to you by your health care provider. Make sure you discuss any questions you have with your health care provider. Document Released: 12/30/2004 Document Revised: 07/20/2015 Document Reviewed: 06/18/2015 Elsevier Interactive Patient Education  2019 [employer]  Colposcopy Colposcopy is a procedure to examine the lowest part of the uterus (cervix), the vagina, and the area around the vaginal opening (vulva) for abnormalities or signs of disease. The procedure is done using a lighted microscope or magnifying lens (colposcope). If any unusual cells are found during the procedure, your health care provider may remove a tissue sample for testing (biopsy). A colposcopy may be done if you:  Have an abnormal Pap test. A Pap test is a screening test that is used to check for signs of cancer or infection of the vagina, cervix, and uterus.  Have a Pap smear test in which you test positive for high-risk HPV (human papillomavirus).  Have a sore or lesion on your cervix.  Have genital warts on your vulva, vagina, or cervix.  Took certain medicines while pregnant, such as diethylstilbestrol (DES).  Have pain during sexual intercourse.  Have vaginal bleeding, especially after sexual intercourse.  Need to have a cervical polyp removed.  Need to have a lost intrauterine device (IUD) string located. Let  your health care provider know about:  Any allergies you have, including allergies to prescribed medicine, latex, or iodine.  All medicines you are taking, including vitamins, herbs, eye drops, creams, and over-the-counter medicines. Bring a list of all of your medicines to your appointment.  Any problems you or family members have had with anesthetic medicines.  Any blood disorders you have.  Any surgeries you have had.  Any medical conditions you have, such as pelvic inflammatory disease (PID) or endometrial disorder.  Any history of frequent fainting.  Your menstrual cycle and what form of birth control (contraception) you use.  Your medical history, including any prior cervical treatment.  Whether you are pregnant or may be pregnant. What are the risks? Generally, this is a safe procedure. However, problems may occur, including:  Pain.  Infection, which may include a fever, bad-smelling discharge, or pelvic pain.  Bleeding or discharge.  Misdiagnosis.  Fainting and vasovagal reactions, but this is rare.  Allergic reactions to medicines.  Damage to other structures or organs. What happens before the procedure?  If you have your menstrual period or will have it at the time of your procedure, tell your health care provider. A colposcopy typically is not done during menstruation.  Continue your contraceptive practices before and  after the procedure.  For 24 hours before the colposcopy: ? Do not douche. ? Do not use tampons. ? Do not use medicines, creams, or suppositories in the vagina. ? Do not have sexual intercourse.  Ask your health care provider about: ? Changing or stopping your regular medicines. This is especially important if you are taking diabetes medicines or blood thinners. ? Taking medicines such as aspirin and ibuprofen. These medicines can thin your blood. Do not take these medicines before your procedure if your health care provider instructs you  not to. It is likely that your health care provider will tell you to avoid taking aspirin or medicine that contains aspirin for 7 days before the procedure.  Follow instructions from your health care provider about eating or drinking restrictions. You will likely need to eat a regular diet the day of the procedure and not skip any meals.  You may have an exam or testing. A pregnancy test will be taken on the day of the procedure.  You may have a blood or urine sample taken.  Plan to have someone take you home from the hospital or clinic.  If you will be going home right after the procedure, plan to have someone with you for 24 hours. What happens during the procedure?  You will lie down on your back, with your feet in foot rests (stirrups).  A warmed and lubricated instrument (speculum) will be inserted into your vagina. The speculum will be used to hold apart the walls of your vagina so your health care provider can see your cervix and the inside of your vagina.  A cotton swab will be used to place a small amount of liquid solution on the areas to be examined. This solution makes it easier to see abnormal cells. You may feel a slight burning during this part.  The colposcope will be used to scan the cervix with a bright white light. The colposcope will be held near your vulvaand will magnify your vulva, vagina, and cervix for easier examination.  Your health care provider may decide to take a biopsy. If so: ? You may be given medicine to numb the area (local anesthetic). ? Surgical instruments will be used to suck out mucus and cells through your vagina. ? You may feel mild pain while the tissue sample is removed. ? Bleeding may occur. A solution may be used to stop the bleeding. ? If a sample of tissue is needed from the inside of the cervix, a different procedure called endocervical curettage (ECC) may be completed. During this procedure, a curved instrument (curette) will be used to  scrape cells from your cervix or the top of your cervix (endocervix).  Your health care provider will record the location of any abnormalities. The procedure may vary among health care providers and hospitals. What happens after the procedure?  You will lie down and rest for a few minutes. You may be offered juice or cookies.  Your blood pressure, heart rate, breathing rate, and blood oxygen level will be monitored until any medicines you were given have worn off.  You may have to wear compression stockings. These stockings help to prevent blood clots and reduce swelling in your legs.  You may have some cramping in your abdomen. This should go away after a few minutes. This information is not intended to replace advice given to you by your health care provider. Make sure you discuss any questions you have with your health care provider. Document Released:  03/22/2002 Document Revised: 08/28/2015 Document Reviewed: 08/06/2015 Elsevier Interactive Patient Education  [employer]2019 [employer]
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
## 2018-01-29 ENCOUNTER — Encounter: Payer: Self-pay | Admitting: Family Medicine

## 2018-01-29 DIAGNOSIS — F329 Major depressive disorder, single episode, unspecified: Secondary | ICD-10-CM | POA: Insufficient documentation

## 2018-01-29 DIAGNOSIS — F32A Depression, unspecified: Secondary | ICD-10-CM | POA: Insufficient documentation

## 2018-01-29 DIAGNOSIS — F419 Anxiety disorder, unspecified: Secondary | ICD-10-CM

## 2018-01-29 DIAGNOSIS — R87613 High grade squamous intraepithelial lesion on cytologic smear of cervix (HGSIL): Secondary | ICD-10-CM | POA: Insufficient documentation

## 2018-01-29 DIAGNOSIS — Z975 Presence of (intrauterine) contraceptive device: Secondary | ICD-10-CM | POA: Insufficient documentation

## 2018-01-29 DIAGNOSIS — M21619 Bunion of unspecified foot: Secondary | ICD-10-CM | POA: Insufficient documentation

## 2018-01-29 DIAGNOSIS — Z8669 Personal history of other diseases of the nervous system and sense organs: Secondary | ICD-10-CM | POA: Insufficient documentation

## 2018-02-15 ENCOUNTER — Telehealth: Payer: Self-pay

## 2018-02-15 NOTE — Telephone Encounter (Signed)
Spoke with pt voiced understanding that her Nexplanon request forms were completed and faxed, pt stated that the implant had  expired in January and that she wanted to have it removed. Patient was advised to wait for the replacement and the nexplanon will be taken out then, pt insists she want to have it taken out and wanted to schedule an appointment to have it removed at our office, dr Salomon Fick advised pt to go to her OBGYN to have them remove it if she is not willing to wait for the replacement.

## 2018-02-22 NOTE — Telephone Encounter (Addendum)
[facility] customer support  with nexplanon will cancel the request for nexplanon. The forms that was faxed has expired and md can go to website [email or web address] to obtain the forms. Brenda Hutchinson is aware md advise the pt to go to her OBGYN

## 2018-02-24 NOTE — Telephone Encounter (Signed)
FYI

## 2018-03-24 ENCOUNTER — Ambulatory Visit (INDEPENDENT_AMBULATORY_CARE_PROVIDER_SITE_OTHER): Payer: BC Managed Care – PPO | Admitting: Family Medicine

## 2018-03-24 ENCOUNTER — Encounter: Payer: Self-pay | Admitting: Family Medicine

## 2018-03-24 ENCOUNTER — Other Ambulatory Visit: Payer: Self-pay

## 2018-03-24 VITALS — BP 102/76 | Temp 98.4°F | Wt 92.0 lb

## 2018-03-24 DIAGNOSIS — Z975 Presence of (intrauterine) contraceptive device: Secondary | ICD-10-CM

## 2018-03-24 NOTE — Progress Notes (Signed)
Subjective:    Patient ID: Brenda Hutchinson, female    DOB: 22-Jan-1995, 23 y.o.   MRN: [id number]  No chief complaint on file.   HPI Patient was seen today for Nexplanon removal.  Nexplanon due for removal January 2020.  Pt unsure if she would like another placed.  Pt scheduled to f/u with OB/Gyn for repeat pap/colpo in a few wks.  Pt did not want to wait to have Nexplanon removed at that time.  Past Medical History:  Diagnosis Date  . Bunion of right foot 11/2016  . Chicken pox   . Depression   . Family history of adverse reaction to anesthesia    pt's son has hx. of being hard to wake up post-op  . Frequent headaches   . History of kidney stones   . Migraines     No Known Allergies  ROS General: Denies fever, chills, night sweats, changes in weight, changes in appetite HEENT: Denies headaches, ear pain, changes in vision, rhinorrhea, sore throat CV: Denies CP, palpitations, SOB, orthopnea Pulm: Denies SOB, cough, wheezing GI: Denies abdominal pain, nausea, vomiting, diarrhea, constipation GU: Denies dysuria, hematuria, frequency, vaginal discharge Msk: Denies muscle cramps, joint pains Neuro: Denies weakness, numbness, tingling Skin: Denies rashes, bruising Psych: Denies depression, anxiety, hallucinations    Objective:    Blood pressure 102/76, temperature 98.4 F (36.9 C), weight 92 lb (41.7 kg).  Gen. Pleasant, well-nourished, in no distress, normal affect  Lungs: no accessory muscle use Cardiovascular: RRR, no peripheral edema Musculoskeletal: No deformities, no cyanosis or clubbing, normal tone Neuro:  A&Ox3, CN II-XII intact, normal gait Skin:  Warm, no lesions/ rash.  Nexplanon palpated in L arm  Wt Readings from Last 3 Encounters:  01/28/18 89 lb (40.4 kg)  12/11/16 85 lb (38.6 kg)    Lab Results  Component Value Date   WBC 8.0 05/07/2016   HGB 12.7 05/07/2016   HCT 37.0 05/07/2016   PLT 230 05/07/2016   GLUCOSE 96 05/07/2016   ALT 17  05/07/2016   AST 31 05/07/2016   NA 139 05/07/2016   K 3.2 (L) 05/07/2016   CL 103 05/07/2016   CREATININE 0.78 05/07/2016   BUN 9 05/07/2016   CO2 24 05/07/2016    Assessment/Plan:  Nexplanon Removal Procedure Note  Indications: Nexplanon due for removal January 2020  Anesthesia: 1% plain lidocaine  Procedure Details  The procedure, risks and complications have been discussed in detail (including, but not limited to infection, bleeding, and damage to surrounding structures) with the patient, and the patient has consented to the procedure.  Nexplanon palpated.  The skin was marked, sterilely prepped and draped over the affected area in the usual fashion. After adequate local anesthesia with lidocaine 1% without epinepherine, incision with a #15 blade was performed on the left arm.  Nexplanon deeper in arm than anticipated.  Unsuccessful attempts made to remove the device.  Area cleaned, steri strips placed, and arm dressed.  EBL: minimal  Condition: Tolerated procedure well and Stable   Complications: unable to remove Nexplanon device.Marland Kitchen   Nexplanon in place -As nexplanon deeper than anticipate attempts to remove the device unsuccessful.  Pt requested this provider "keep cutting".  Area was wrapped with gauze and coban. -advised bruising likely.  Ok to use ice and NSAIDs prn. -Pt advised to have device removed by OB/gyn during appt in the next few wks. -pt declined handouts  F/u prn  Abbe Amsterdam, MD

## 2018-05-10 ENCOUNTER — Telehealth: Payer: BC Managed Care – PPO | Admitting: Family

## 2018-05-10 DIAGNOSIS — H00015 Hordeolum externum left lower eyelid: Secondary | ICD-10-CM

## 2018-05-10 MED ORDER — NEOMYCIN-POLYMYXIN-HC 3.5-10000-1 OP SUSP: 3.0000 [drp] | Freq: Four times a day (QID) | OPHTHALMIC | 0 refills | Status: DC

## 2018-05-10 NOTE — Progress Notes (Signed)
We are sorry that you are not feeling well. Here is how we plan to help!  Based on what you have shared with me it looks like you have a stye.  A stye is an inflammation of the eyelid.  It is often a red, painful lump near the edge of the eyelid that may look like a boil or a pimple.  A stye develops when an infection occurs at the base of an eyelash.   We have made appropriate suggestions for you based upon your presentation: Your symptoms may indicate an infection of the sclera.  The use of anti-inflammatory and antibiotic eye drops for a week will help resolve this condition.  I have sent in neomycin-polymyxin HC opthalmic suspension, two to three drops in the affected eye every 4 hours.  If your symptoms do not improve over the next two to three days you should be seen in your doctor's office.  Approximately 5 minutes was spent documenting and reviewing patient's chart.   HOME CARE:   Wash your hands often!  Let the stye open on its own. Don't squeeze or open it.  Don't rub your eyes. This can irritate your eyes and let in bacteria.  If you need to touch your eyes, wash your hands first.  Don't wear eye makeup or contact lenses until the area has healed.  GET HELP RIGHT AWAY IF:   Your symptoms do not improve.  You develop blurred or loss of vision.  Your symptoms worsen (increased discharge, pain or redness).  Thank you for choosing an e-visit.  Your e-visit answers were reviewed by a board certified advanced clinical practitioner to complete your personal care plan.  Depending upon the condition, your plan could have included both over the counter or prescription medications.  Please review your pharmacy choice.  Make sure the pharmacy is open so you can pick up prescription now.  If there is a problem, you may contact your provider through MyChart messaging and have the prescription routed to another pharmacy.    Your safety is important to us.  If you have drug allergies  check your prescription carefully.  For the next 24 hours you can use MyChart to ask questions about today's visit, request a non-urgent call back, or ask for a work or school excuse.  You will get an email in the next two days asking about your experience.  I hope you that your e-visit has been valuable and will speed your recovery.    

## 2018-05-12 MED ORDER — BACITRACIN-POLYMYXIN B 500-10000 UNIT/GM OP OINT: 1.0000 "application " | TOPICAL_OINTMENT | Freq: Four times a day (QID) | OPHTHALMIC | 0 refills | Status: DC

## 2018-05-12 NOTE — Addendum Note (Signed)
Addended by: Jannifer Rodney A on: 05/12/2018 06:50 PM   Modules accepted: Orders

## 2018-05-12 NOTE — Progress Notes (Signed)
I have sent bacitracin-polymyxin ointment that you will apply four times a day. Hope you feel better soon!  Jannifer Rodney, FNP

## 2018-07-19 ENCOUNTER — Ambulatory Visit (INDEPENDENT_AMBULATORY_CARE_PROVIDER_SITE_OTHER): Payer: BC Managed Care – PPO | Admitting: Family Medicine

## 2018-07-19 ENCOUNTER — Other Ambulatory Visit: Payer: Self-pay

## 2018-07-19 ENCOUNTER — Encounter: Payer: Self-pay | Admitting: Family Medicine

## 2018-07-19 DIAGNOSIS — M545 Low back pain, unspecified: Secondary | ICD-10-CM

## 2018-07-19 DIAGNOSIS — M25551 Pain in right hip: Secondary | ICD-10-CM | POA: Diagnosis not present

## 2018-07-19 DIAGNOSIS — M25552 Pain in left hip: Secondary | ICD-10-CM | POA: Diagnosis not present

## 2018-07-19 MED ORDER — CYCLOBENZAPRINE HCL 5 MG PO TABS: 5.0000 mg | ORAL_TABLET | Freq: Three times a day (TID) | ORAL | 1 refills | Status: DC | PRN
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
## 2018-07-20 ENCOUNTER — Encounter: Payer: Self-pay | Admitting: Family Medicine

## 2018-08-05 ENCOUNTER — Ambulatory Visit: Payer: BC Managed Care – PPO | Admitting: Physical Therapy

## 2018-08-12 ENCOUNTER — Ambulatory Visit: Payer: BC Managed Care – PPO | Attending: Family Medicine | Admitting: Physical Therapy

## 2018-08-12 ENCOUNTER — Other Ambulatory Visit: Payer: Self-pay

## 2018-08-12 ENCOUNTER — Encounter: Payer: Self-pay | Admitting: Physical Therapy

## 2018-08-12 DIAGNOSIS — M25551 Pain in right hip: Secondary | ICD-10-CM

## 2018-08-12 DIAGNOSIS — M25552 Pain in left hip: Secondary | ICD-10-CM | POA: Insufficient documentation

## 2018-08-12 DIAGNOSIS — M6281 Muscle weakness (generalized): Secondary | ICD-10-CM | POA: Diagnosis present

## 2018-08-12 DIAGNOSIS — M545 Low back pain, unspecified: Secondary | ICD-10-CM

## 2018-08-12 NOTE — Patient Instructions (Signed)
Access Code: [email or web address]  URL: [email or web address]  Date: 08/12/2018  Prepared by: Venetia Night Quanisha Drewry   Exercises  Supine Piriformis Stretch with Leg Straight - 3 reps - 2 sets - 30 hold - 1x daily - 7x weekly  Supine Hip Internal and External Rotation - 10 reps - 2 sets - 1x daily - 7x weekly  Standing Hamstring Stretch with Step - 3 reps - 2 sets - 30 hold - 1x daily - 7x weekly  Seated Table Piriformis Stretch - 3 reps - 2 sets - 30 hold - 1x daily - 7x weekly  Patient Education  Trigger Point Dry Needling
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
## 2018-08-19 ENCOUNTER — Ambulatory Visit: Payer: BC Managed Care – PPO | Admitting: Physical Therapy

## 2018-08-23 ENCOUNTER — Other Ambulatory Visit: Payer: Self-pay

## 2018-08-23 ENCOUNTER — Encounter: Payer: Self-pay | Admitting: Physical Therapy

## 2018-08-23 ENCOUNTER — Ambulatory Visit: Payer: BC Managed Care – PPO | Attending: Family Medicine | Admitting: Physical Therapy

## 2018-08-23 DIAGNOSIS — M25551 Pain in right hip: Secondary | ICD-10-CM

## 2018-08-23 DIAGNOSIS — M25552 Pain in left hip: Secondary | ICD-10-CM

## 2018-08-23 DIAGNOSIS — M6281 Muscle weakness (generalized): Secondary | ICD-10-CM

## 2018-08-23 DIAGNOSIS — M545 Low back pain, unspecified: Secondary | ICD-10-CM
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
## 2018-08-23 NOTE — Patient Instructions (Signed)
Access Code: [email or web address]  URL: [email or web address]  Date: 08/23/2018  Prepared by: Venetia Night Beuhring   Exercises  Supine Piriformis Stretch with Leg Straight - 3 reps - 2 sets - 30 hold - 1x daily - 7x weekly  Supine Hip Internal and External Rotation - 10 reps - 2 sets - 1x daily - 7x weekly  Standing Hamstring Stretch with Step - 3 reps - 2 sets - 30 hold - 1x daily - 7x weekly  Seated Table Piriformis Stretch - 3 reps - 2 sets - 30 hold - 1x daily - 7x weekly  Supine ITB Stretch with Strap - 3 reps - 1 sets - 30 hold - 1x daily - 7x weekly  Supine Double Knee to Chest - 10 reps - 3 sets - 1x daily - 7x weekly  Supine Lower Trunk Rotation - 10 reps - 2 sets - 1x daily - 7x weekly  Patient Education  Trigger Point Dry Needling

## 2018-08-30 ENCOUNTER — Ambulatory Visit: Payer: BC Managed Care – PPO

## 2018-09-07 ENCOUNTER — Ambulatory Visit: Payer: BC Managed Care – PPO | Admitting: Physical Therapy

## 2018-09-07 ENCOUNTER — Telehealth: Payer: Self-pay | Admitting: Physical Therapy

## 2018-09-07 NOTE — Telephone Encounter (Signed)
No show. PT attempted to call pt regarding missed appointment and left voicemail to return call at her earliest convenience.    4:45 PM,09/07/18 [place], [place] at [place]

## 2018-09-15 ENCOUNTER — Encounter: Payer: BC Managed Care – PPO | Admitting: Physical Therapy

## 2018-09-21 ENCOUNTER — Encounter: Payer: BC Managed Care – PPO | Admitting: Physical Therapy

## 2018-09-28 ENCOUNTER — Encounter: Payer: BC Managed Care – PPO | Admitting: Physical Therapy

## 2018-10-05 ENCOUNTER — Encounter: Payer: BC Managed Care – PPO | Admitting: Physical Therapy

## 2019-03-19 ENCOUNTER — Ambulatory Visit: Payer: BC Managed Care – PPO

## 2019-03-28 ENCOUNTER — Encounter: Payer: Self-pay | Admitting: Certified Nurse Midwife

## 2019-03-30 ENCOUNTER — Encounter: Payer: Self-pay | Admitting: Certified Nurse Midwife

## 2019-04-14 ENCOUNTER — Ambulatory Visit: Payer: BC Managed Care – PPO | Attending: Internal Medicine

## 2019-04-14 DIAGNOSIS — Z23 Encounter for immunization: Secondary | ICD-10-CM

## 2019-04-14 NOTE — Progress Notes (Signed)
   Covid-19 Vaccination Clinic  Name:  Brenda Hutchinson    MRN: [id number] DOB: Sep 29, 1995  04/14/2019  [name] was observed post Covid-19 immunization for 15 minutes without incident. She was provided with Vaccine Information Sheet and instruction to access the V-Safe system.   [name] was instructed to call 911 with any severe reactions post vaccine: Marland Kitchen Difficulty breathing  . Swelling of face and throat  . A fast heartbeat  . A bad rash all over body  . Dizziness and weakness   Immunizations Administered    Name Date Dose VIS Date Route   Pfizer COVID-19 Vaccine 04/14/2019  3:51 PM 0.3 mL 12/24/2018 Intramuscular   Manufacturer: [employer], [employer]   Lot: [id number]   NDC: [address]-1

## 2019-05-09 ENCOUNTER — Ambulatory Visit: Payer: BC Managed Care – PPO | Attending: Internal Medicine

## 2019-05-09 DIAGNOSIS — Z23 Encounter for immunization: Secondary | ICD-10-CM

## 2019-05-09 NOTE — Progress Notes (Signed)
   Covid-19 Vaccination Clinic  Name:  Brenda Hutchinson    MRN: [id number] DOB: 11-Feb-1995  05/09/2019  [name] was observed post Covid-19 immunization for 15 minutes without incident. She was provided with Vaccine Information Sheet and instruction to access the V-Safe system.   [name] was instructed to call 911 with any severe reactions post vaccine: Marland Kitchen Difficulty breathing  . Swelling of face and throat  . A fast heartbeat  . A bad rash all over body  . Dizziness and weakness   Immunizations Administered    Name Date Dose VIS Date Route   Pfizer COVID-19 Vaccine 05/09/2019  4:53 PM 0.3 mL 03/09/2018 Intramuscular   Manufacturer: [employer], [employer]   Lot: [id number]   NDC: [address]-9

## 2019-10-25 ENCOUNTER — Telehealth: Payer: Self-pay | Admitting: Family Medicine

## 2019-10-25 NOTE — Telephone Encounter (Signed)
Pt call and want to start the HPV shot and want a call back .

## 2019-10-26 NOTE — Telephone Encounter (Signed)
Okay to start patient on HPV Vaccine

## 2019-11-02 NOTE — Telephone Encounter (Signed)
Ok.  Can further discuss at upcoming appt.

## 2019-11-07 ENCOUNTER — Other Ambulatory Visit (INDEPENDENT_AMBULATORY_CARE_PROVIDER_SITE_OTHER): Payer: BC Managed Care – PPO

## 2019-11-07 ENCOUNTER — Other Ambulatory Visit: Payer: Self-pay

## 2019-11-07 ENCOUNTER — Ambulatory Visit: Payer: BC Managed Care – PPO | Admitting: Family Medicine

## 2019-11-07 DIAGNOSIS — Z23 Encounter for immunization: Secondary | ICD-10-CM | POA: Diagnosis not present

## 2019-11-07 NOTE — Progress Notes (Signed)
Per orders of Dr. Salomon Fick, injection of HPVEleonore Chiquito) given by Carola Rhine. Patient tolerated injection well.

## 2019-11-08 NOTE — Telephone Encounter (Signed)
Pt had her first HPV immunization on 11/07/2019

## 2020-01-03 ENCOUNTER — Other Ambulatory Visit: Payer: Self-pay

## 2020-01-03 ENCOUNTER — Ambulatory Visit (INDEPENDENT_AMBULATORY_CARE_PROVIDER_SITE_OTHER): Payer: BC Managed Care – PPO

## 2020-01-03 DIAGNOSIS — Z23 Encounter for immunization: Secondary | ICD-10-CM

## 2020-01-03 NOTE — Progress Notes (Signed)
Pt came in for her 2nd HPV vaccine. Vaccine given in the left deltoid, pt tolerated well. Pt is aware 3rd injection is in 6 months.

## 2020-07-03 ENCOUNTER — Other Ambulatory Visit: Payer: Self-pay

## 2020-07-03 ENCOUNTER — Ambulatory Visit (INDEPENDENT_AMBULATORY_CARE_PROVIDER_SITE_OTHER): Payer: BC Managed Care – PPO

## 2020-07-03 DIAGNOSIS — Z23 Encounter for immunization: Secondary | ICD-10-CM | POA: Diagnosis not present

## 2020-07-03 NOTE — Progress Notes (Signed)
Pt was given 3rd HPV vaccine in left deltoid, tolerated well.

## 2021-01-31 ENCOUNTER — Encounter: Payer: Self-pay | Admitting: Family Medicine

## 2021-01-31 ENCOUNTER — Ambulatory Visit (INDEPENDENT_AMBULATORY_CARE_PROVIDER_SITE_OTHER): Payer: 59 | Admitting: Family Medicine

## 2021-01-31 VITALS — BP 106/80 | HR 84 | Temp 98.4°F | Wt 94.6 lb

## 2021-01-31 DIAGNOSIS — F419 Anxiety disorder, unspecified: Secondary | ICD-10-CM | POA: Diagnosis not present

## 2021-01-31 DIAGNOSIS — F321 Major depressive disorder, single episode, moderate: Secondary | ICD-10-CM | POA: Diagnosis not present

## 2021-01-31 MED ORDER — ESCITALOPRAM OXALATE 10 MG PO TABS: 10.0000 mg | ORAL_TABLET | Freq: Every day | ORAL | 3 refills | Status: DC

## 2021-01-31 NOTE — Progress Notes (Signed)
Subjective:    Patient ID: Brenda Hutchinson, female    DOB: 1995/10/10, 26 y.o.   MRN: [id number]  Chief Complaint  Patient presents with   Anxiety    Wants to discuss anxiety meds, has been feeling anxious for a while -2 months.has tried the ashwaganda vitamins    HPI Patient was seen today for ongoing concern.  Pt endorses increased anxiety, mood changes, increased sleep, decreased energy, poor appetite.  Has been quick to get irritated/snappy with family members which is starting to affect relationships.  Pt is in counseling.  Endorses family h/o anxiety/depression in mom who is on Lexapro.    Past Medical History:  Diagnosis Date   Bunion of right foot 11/2016   Chicken pox    Depression    Family history of adverse reaction to anesthesia    pt's son has hx. of being hard to wake up post-op   Frequent headaches    History of kidney stones    Migraines     No Known Allergies  ROS General: Denies fever, chills, night sweats, changes in weight +changes in appetite, decreased energy HEENT: Denies headaches, ear pain, changes in vision, rhinorrhea, sore throat CV: Denies CP, palpitations, SOB, orthopnea Pulm: Denies SOB, cough, wheezing GI: Denies abdominal pain, nausea, vomiting, diarrhea, constipation GU: Denies dysuria, hematuria, frequency, vaginal discharge Msk: Denies muscle cramps, joint pains Neuro: Denies weakness, numbness, tingling Skin: Denies rashes, bruising Psych: Denies depression, anxiety, hallucinations  +anxiety, depression    Objective:    Blood pressure 106/80, pulse 84, temperature 98.4 F (36.9 C), temperature source Oral, weight 94 lb 9.6 oz (42.9 kg), SpO2 96 %.  Gen. Pleasant, well-nourished, in no distress, normal affect   HEENT: [place]/AT, face symmetric, conjunctiva clear, no scleral icterus, PERRLA, EOMI, nares patent without drainage, Lungs: no accessory muscle use Cardiovascular: RRR, no peripheral edema Musculoskeletal: No deformities,  no cyanosis or clubbing, normal tone Neuro:  A&Ox3, CN II-XII intact, normal gait Skin:  Warm, no lesions/ rash   Wt Readings from Last 3 Encounters:  01/31/21 94 lb 9.6 oz (42.9 kg)  07/19/18 89 lb 6.4 oz (40.6 kg)  03/24/18 92 lb (41.7 kg)    Lab Results  Component Value Date   WBC 8.0 05/07/2016   HGB 12.7 05/07/2016   HCT 37.0 05/07/2016   PLT 230 05/07/2016   GLUCOSE 96 05/07/2016   ALT 17 05/07/2016   AST 31 05/07/2016   NA 139 05/07/2016   K 3.2 (L) 05/07/2016   CL 103 05/07/2016   CREATININE 0.78 05/07/2016   BUN 9 05/07/2016   CO2 24 05/07/2016   Depression screen PHQ 2/9 01/31/2021  Decreased Interest 3  Down, Depressed, Hopeless 2  PHQ - 2 Score 5  Altered sleeping 2  Tired, decreased energy 2  Change in appetite 2  Feeling bad or failure about yourself  2  Trouble concentrating 1  Moving slowly or fidgety/restless 0  Suicidal thoughts 0  PHQ-9 Score 14   GAD 7 : Generalized Anxiety Score 01/31/2021  Nervous, Anxious, on Edge 3  Control/stop worrying 2  Worry too much - different things 2  Trouble relaxing 2  Restless 0  Easily annoyed or irritable 3  Afraid - awful might happen 1  Total GAD 7 Score 13  Anxiety Difficulty Very difficult    Assessment/Plan:  Anxiety  -GAD 7 score 13 this visit -will start lexapro. -continue counseling - Plan: escitalopram (LEXAPRO) 10 MG tablet  Depression, major, single episode,  moderate (HCC)  -PHQ 9 score 14 this visit -discussed medication options, r/b/a.  Pt wishes to start lexapro -continue counseling. -discussed self care - Plan: escitalopram (LEXAPRO) 10 MG tablet  F/u in 4-6 wks  Grier Mitts, MD

## 2021-02-25 ENCOUNTER — Other Ambulatory Visit: Payer: Self-pay | Admitting: Family Medicine

## 2021-02-25 DIAGNOSIS — F419 Anxiety disorder, unspecified: Secondary | ICD-10-CM

## 2021-02-25 DIAGNOSIS — F321 Major depressive disorder, single episode, moderate: Secondary | ICD-10-CM

## 2021-02-25 NOTE — Telephone Encounter (Signed)
Ok for 90 day supply.  

## 2021-02-25 NOTE — Telephone Encounter (Signed)
Patient started on this medication last month.  Was advised to follow-up in 4-6 weeks.  Given 30-day supply with 3 refills at the time of January 2023 visit in case of medication adjustments are needed.

## 2021-10-30 ENCOUNTER — Telehealth (INDEPENDENT_AMBULATORY_CARE_PROVIDER_SITE_OTHER): Payer: Self-pay | Admitting: Family Medicine

## 2021-10-30 ENCOUNTER — Encounter: Payer: Self-pay | Admitting: Family Medicine

## 2021-10-30 DIAGNOSIS — F419 Anxiety disorder, unspecified: Secondary | ICD-10-CM

## 2021-10-30 NOTE — Progress Notes (Signed)
Virtual Visit via Video Note  I connected with Brenda Hutchinson on 10/30/21 at  4:30 PM EDT by a video enabled telemedicine application 2/2 [id number]-[id number] pandemic and verified that I am speaking with the correct person using two identifiers.  Location patient: home Location provider:work or home office Persons participating in the virtual visit: patient, provider  I discussed the limitations of evaluation and management by telemedicine and the availability of in person appointments. The patient expressed understanding and agreed to proceed.  Chief Complaint  Patient presents with   Medication Refill    refill    HPI: Pt is a 26 yo female seen for f/u.  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  States the change has been good.  Pt tried lexapro 10 mg for 1 wk after that visit, then stopped.  Recently felt like she may need the medication due to increased anxiety, becoming short tempered.  Pt started taking lexapro consistently for the last 2 wks.  Hasn't noticed a change yet. Sleep has been good.  Appetite is ok. Sometimes doesn't feel eating.  Will drink boost supplemental drink if doesn't eat.    In the past pt's mom was on Lexapro.  ROS: See pertinent positives and negatives per HPI.  Past Medical History:  Diagnosis Date   Bunion of right foot 11/2016   Chicken pox    Depression    Family history of adverse reaction to anesthesia    pt's son has hx. of being hard to wake up post-op   Frequent headaches    History of kidney stones    Migraines     Past Surgical History:  Procedure Laterality Date   BUNIONECTOMY Right 12/11/2016   Procedure: Right Lapidus and Modified Alphonzo Grieve;  Surgeon: Wylene Simmer, MD;  Location: [facility];  Service: Orthopedics;  Laterality: Right;   CESAREAN SECTION      Family History  Problem Relation Age of Onset   Anesthesia problems Son        hard to wake up post-op     Current Outpatient  Medications:    escitalopram (LEXAPRO) 10 MG tablet, Take 1 tablet (10 mg total) by mouth daily., Disp: 30 tablet, Rfl: 3  EXAM:  VITALS per patient if applicable:  RR 32-35 bpm  GENERAL: alert, oriented, appears well and in no acute distress  HEENT: atraumatic, conjunctiva clear, no obvious abnormalities on inspection of external nose and ears  NECK: normal movements of the head and neck  LUNGS: on inspection no signs of respiratory distress, breathing rate appears normal, no obvious gross SOB, gasping or wheezing  CV: no obvious cyanosis  MS: moves all visible extremities without noticeable abnormality  PSYCH/NEURO: pleasant and cooperative, no obvious depression or anxiety, speech and thought processing grossly intact  ASSESSMENT AND PLAN:  Discussed the following assessment and plan:  Anxiety -pt restated lexapro 10 mg for increased anxiety symptoms. -will reassess in 3-4 wks to see if dose adjustment needed. -should still have refills available at pharmacy.  If not will send in new rx. -self care encouraged -consider counseling.   f/u in 3-4 wks   I discussed the assessment and treatment plan with the patient. The patient was provided an opportunity to ask questions and all were answered. The patient agreed with the plan and demonstrated an understanding of the instructions.   The patient was advised to call back or seek an in-person evaluation if the symptoms worsen or if  the condition fails to improve as anticipated.   Billie Ruddy, MD

## 2021-11-19 ENCOUNTER — Encounter: Payer: Self-pay | Admitting: Family Medicine

## 2021-11-19 ENCOUNTER — Other Ambulatory Visit: Payer: Self-pay | Admitting: Family Medicine

## 2021-11-19 DIAGNOSIS — F419 Anxiety disorder, unspecified: Secondary | ICD-10-CM

## 2021-11-19 DIAGNOSIS — F321 Major depressive disorder, single episode, moderate: Secondary | ICD-10-CM

## 2021-11-20 MED ORDER — ESCITALOPRAM OXALATE 10 MG PO TABS: 10.0000 mg | ORAL_TABLET | Freq: Every day | ORAL | 3 refills | Status: DC

## 2021-11-20 MED ORDER — ESCITALOPRAM OXALATE 10 MG PO TABS: 10.0000 mg | ORAL_TABLET | Freq: Every day | ORAL | 0 refills | Status: DC

## 2021-12-12 ENCOUNTER — Encounter: Payer: Self-pay | Admitting: Family Medicine

## 2021-12-12 ENCOUNTER — Telehealth (INDEPENDENT_AMBULATORY_CARE_PROVIDER_SITE_OTHER): Payer: Self-pay | Admitting: Family Medicine

## 2021-12-12 DIAGNOSIS — F321 Major depressive disorder, single episode, moderate: Secondary | ICD-10-CM

## 2021-12-12 DIAGNOSIS — F419 Anxiety disorder, unspecified: Secondary | ICD-10-CM

## 2021-12-12 MED ORDER — ESCITALOPRAM OXALATE 10 MG PO TABS: 10.0000 mg | ORAL_TABLET | Freq: Every day | ORAL | 1 refills | Status: DC

## 2021-12-12 NOTE — Progress Notes (Signed)
Virtual Visit via Video Note  I connected with Rayann Heman on 12/12/21 at  1:00 PM EST by a video enabled telemedicine application 2/2 COVID-19 pandemic and verified that I am speaking with the correct person using two identifiers.  Location patient: home Location provider:work or home office Persons participating in the virtual visit: patient, provider  I discussed the limitations of evaluation and management by telemedicine and the availability of in person appointments. The patient expressed understanding and agreed to proceed.  Chief Complaint  Patient presents with   Follow-up    HPI: Patient seen for follow-up on anxiety/depression.  Pt started taking lexapro 10 mg daily consistently.  States has noticed a difference for the better.  Not as irritable.  Using water bottle with a pill organizer and the side of it that helps pt remember to take meds.  Appetite is still decreased.  Planning on eating small meals throughout the day as well as drinking supplemental drinks.  Patient spent time with family for Thanksgiving instead of staying by herself.  Family has noticed a difference in pt.  ROS: See pertinent positives and negatives per HPI.  Past Medical History:  Diagnosis Date   Bunion of right foot 11/2016   Chicken pox    Depression    Family history of adverse reaction to anesthesia    pt's son has hx. of being hard to wake up post-op   Frequent headaches    History of kidney stones    Migraines     Past Surgical History:  Procedure Laterality Date   BUNIONECTOMY Right 12/11/2016   Procedure: Right Lapidus and Modified Orpha Bur;  Surgeon: Toni Arthurs, MD;  Location: [facility];  Service: Orthopedics;  Laterality: Right;   CESAREAN SECTION      Family History  Problem Relation Age of Onset   Anesthesia problems Son        hard to wake up post-op     Current Outpatient Medications:    ELDERBERRY PO, Take by mouth daily., Disp: ,  Rfl:    escitalopram (LEXAPRO) 10 MG tablet, Take 1 tablet (10 mg total) by mouth daily., Disp: 30 tablet, Rfl: 0   Multiple Vitamins-Minerals (HM MULTIVITAMIN ADULT GUMMY PO), Take by mouth., Disp: , Rfl:   EXAM:  VITALS per patient if applicable: RR between 12-20 bpm  GENERAL: alert, oriented, appears well and in no acute distress  HEENT: atraumatic, conjunctiva clear, no obvious abnormalities on inspection of external nose and ears  NECK: normal movements of the head and neck  LUNGS: on inspection no signs of respiratory distress, breathing rate appears normal, no obvious gross SOB, gasping or wheezing  CV: no obvious cyanosis  MS: moves all visible extremities without noticeable abnormality  PSYCH/NEURO: pleasant and cooperative, no obvious depression or anxiety, speech and thought processing grossly intact     12/12/2021   12:58 PM 01/31/2021    3:15 PM  Depression screen PHQ 2/9  Decreased Interest 0 3  Down, Depressed, Hopeless 0 2  PHQ - 2 Score 0 5  Altered sleeping 3 2  Tired, decreased energy 0 2  Change in appetite 3 2  Feeling bad or failure about yourself  0 2  Trouble concentrating 0 1  Moving slowly or fidgety/restless 0 0  Suicidal thoughts 0 0  PHQ-9 Score 6 14  Difficult doing work/chores Not difficult at all       12/12/2021    1:17 PM 01/31/2021    3:15 PM  GAD 7 : Generalized Anxiety Score  Nervous, Anxious, on Edge 1 3  Control/stop worrying 0 2  Worry too much - different things 1 2  Trouble relaxing 0 2  Restless 0 0  Easily annoyed or irritable 1 3  Afraid - awful might happen 0 1  Total GAD 7 Score 3 13  Anxiety Difficulty  Very difficult     ASSESSMENT AND PLAN:  Discussed the following assessment and plan:  Anxiety -Improving/controlled -GAD-7 score 3 this visit.  Previously 13 on 01/31/2021 -Continue self-care and other supportive efforts -Continue Lexapro 10 mg daily - Plan: escitalopram (LEXAPRO) 10 MG  tablet  Depression, major, single episode, moderate (HCC) -Improving -PHQ-9 score 6 this visit.  Previously 14 on 01/31/2021. -Continue Lexapro 10 mg daily -Continue self-care  - Plan: escitalopram (LEXAPRO) 10 MG tablet  Follow-up as needed in 4-6 months, sooner if needed   I discussed the assessment and treatment plan with the patient. The patient was provided an opportunity to ask questions and all were answered. The patient agreed with the plan and demonstrated an understanding of the instructions.   The patient was advised to call back or seek an in-person evaluation if the symptoms worsen or if the condition fails to improve as anticipated.  Deeann Saint, MD

## 2022-04-24 ENCOUNTER — Telehealth: Payer: Self-pay | Admitting: Family Medicine

## 2022-04-25 ENCOUNTER — Ambulatory Visit (INDEPENDENT_AMBULATORY_CARE_PROVIDER_SITE_OTHER): Payer: No Typology Code available for payment source | Admitting: Family Medicine

## 2022-04-25 VITALS — BP 108/66 | HR 90 | Temp 99.5°F | Wt 100.2 lb

## 2022-04-25 DIAGNOSIS — R051 Acute cough: Secondary | ICD-10-CM

## 2022-04-25 DIAGNOSIS — J069 Acute upper respiratory infection, unspecified: Secondary | ICD-10-CM | POA: Diagnosis not present

## 2022-04-25 LAB — POC COVID19 BINAXNOW: SARS Coronavirus 2 Ag: NEGATIVE

## 2022-04-25 LAB — POCT INFLUENZA A/B
Influenza A, POC: NEGATIVE
Influenza B, POC: NEGATIVE

## 2022-04-25 LAB — POCT RAPID STREP A (OFFICE): Rapid Strep A Screen: NEGATIVE

## 2022-04-25 MED ORDER — FLUTICASONE PROPIONATE 50 MCG/ACT NA SUSP
1.0000 | Freq: Every day | NASAL | 0 refills | Status: AC
Start: 2022-04-25 — End: ?

## 2022-04-25 NOTE — Progress Notes (Signed)
   Established Patient Office Visit   Subjective  Patient ID: Brenda Hutchinson, female    DOB: 09-24-1995  Age: 26 y.o. MRN: [id number]  Chief Complaint  Patient presents with   Itchy eye    X4 days   Nasal Congestion    Patient complains of nasal congestion, x2 days, Tried Dayquil and Nyquil   Cough    Patient complains of cough, Nonproductive,     Pt is a 27 yo female seen for acute concern.  Patient with cough, nasal congestion x 2 days.  Possible sick contacts include niece who is in daycare.  Patient denies fever, chills, nausea, vomiting, ear pain, ear pressure, facial pain/pressure, rhinorrhea.  Tried NyQuil and DayQuil for symptoms.  Missed work due to symptoms.      ROS Negative unless stated above    Objective:     BP 108/66 (Patient Position: Sitting)   Pulse 90   Temp 99.5 F (37.5 C)   Wt 100 lb 3.2 oz (45.5 kg)   SpO2 99%   BMI 19.57 kg/m    Physical Exam Constitutional:      General: She is not in acute distress.    Appearance: Normal appearance.  HENT:     Head: Normocephalic and atraumatic.     Nose: Nose normal.     Mouth/Throat:     Mouth: Mucous membranes are moist.  Cardiovascular:     Rate and Rhythm: Normal rate and regular rhythm.     Heart sounds: Normal heart sounds. No murmur heard.    No gallop.  Pulmonary:     Effort: Pulmonary effort is normal. No respiratory distress.     Breath sounds: Normal breath sounds. No wheezing, rhonchi or rales.  Skin:    General: Skin is warm and dry.  Neurological:     Mental Status: She is alert and oriented to person, place, and time.      Results for orders placed or performed in visit on 04/25/22  POC COVID-19  Result Value Ref Range   SARS Coronavirus 2 Ag Negative Negative  POC Rapid Strep A  Result Value Ref Range   Rapid Strep A Screen Negative Negative  POC Influenza A/B  Result Value Ref Range   Influenza A, POC Negative Negative   Influenza B, POC Negative Negative       Assessment & Plan:  Viral URI -     Fluticasone Propionate; Place 1 spray into both nostrils daily.  Dispense: 16 g; Refill: 0  Acute cough -     POC COVID-19 BinaxNow -     POCT rapid strep A -     POCT Influenza A/B  Flu, strep, COVID testing negative.  Supportive care for viral URI.  Flonase and OTC medications.  Given precautions.  Given note for work.  No follow-ups on file.   Deeann Saint, MD

## 2022-06-19 ENCOUNTER — Encounter: Payer: No Typology Code available for payment source | Admitting: Family Medicine

## 2022-07-04 ENCOUNTER — Encounter: Payer: No Typology Code available for payment source | Admitting: Family Medicine

## 2022-08-06 ENCOUNTER — Encounter: Payer: No Typology Code available for payment source | Admitting: Family Medicine

## 2022-08-28 ENCOUNTER — Encounter: Payer: Self-pay | Admitting: Family Medicine

## 2022-08-28 ENCOUNTER — Ambulatory Visit (INDEPENDENT_AMBULATORY_CARE_PROVIDER_SITE_OTHER): Payer: No Typology Code available for payment source | Admitting: Family Medicine

## 2022-08-28 VITALS — BP 98/58 | HR 97 | Temp 98.2°F | Wt 89.8 lb

## 2022-08-28 DIAGNOSIS — E860 Dehydration: Secondary | ICD-10-CM | POA: Diagnosis not present

## 2022-08-28 DIAGNOSIS — Z20822 Contact with and (suspected) exposure to covid-19: Secondary | ICD-10-CM

## 2022-08-28 DIAGNOSIS — R112 Nausea with vomiting, unspecified: Secondary | ICD-10-CM

## 2022-08-28 DIAGNOSIS — K2970 Gastritis, unspecified, without bleeding: Secondary | ICD-10-CM | POA: Diagnosis not present

## 2022-08-28 LAB — POC COVID19 BINAXNOW: SARS Coronavirus 2 Ag: NEGATIVE

## 2022-08-28 MED ORDER — ONDANSETRON HCL 4 MG PO TABS: 4.0000 mg | ORAL_TABLET | Freq: Three times a day (TID) | ORAL | 0 refills | Status: AC | PRN

## 2022-08-28 MED ORDER — ONDANSETRON HCL 4 MG/2ML IJ SOLN
4.0000 mg | Freq: Once | INTRAMUSCULAR | Status: AC
Start: 2022-08-28 — End: 2022-08-28
  Administered 2022-08-28: 4 mg via INTRAMUSCULAR

## 2022-08-28 MED ORDER — NIRMATRELVIR/RITONAVIR (PAXLOVID)TABLET
3.0000 | ORAL_TABLET | Freq: Two times a day (BID) | ORAL | 0 refills | Status: AC
Start: 2022-08-28 — End: 2022-09-02

## 2022-08-28 NOTE — Progress Notes (Signed)
Established Patient Office Visit   Subjective  Patient ID: Brenda Hutchinson, female    DOB: [birth year]  Age: 27 y.o. MRN: [id number]  Chief Complaint  Patient presents with   Emesis    Emesis, sweats, nausea and HA started Tuesday evening.can't keep anything down, has not been able to take anything for the symptoms.     Patient is a 27 year old female seen for acute illness.  Patient endorses nausea, vomiting, chills, sweating, headaches x 2 days.  Patient states she has not been able to keep anything down since symptoms started Tuesday.  Tried drinking Sprite and pickle juice but vomited shortly after.  Patient notes COVID outbreak at work.  Denies fever, rhinorrhea, nasal congestion, sore throat, cough, diarrhea.  LMP was at the end of July 2024.  Emesis     Past Medical History:  Diagnosis Date   Bunion of right foot 11/2016   Chicken pox    Depression    Family history of adverse reaction to anesthesia    pt's son has hx. of being hard to wake up post-op   Frequent headaches    History of kidney stones    Migraines    Past Surgical History:  Procedure Laterality Date   BUNIONECTOMY Right 12/11/2016   Procedure: Right Lapidus and Modified Orpha Bur;  Surgeon: Toni Arthurs, MD;  Location: [facility];  Service: Orthopedics;  Laterality: Right;   CESAREAN SECTION     Social History   Tobacco Use   Smoking status: Never   Smokeless tobacco: Never  Vaping Use   Vaping status: Never Used  Substance Use Topics   Alcohol use: Yes    Comment: occcasionally   Drug use: No   Family History  Problem Relation Age of Onset   Anesthesia problems Son        hard to wake up post-op   No Known Allergies    Review of Systems  Gastrointestinal:  Positive for vomiting.   Negative unless stated above    Objective:     BP (!) 98/58 (BP Location: Left Arm, Patient Position: Sitting, Cuff Size: Normal)   Pulse 97   Temp 98.2 F (36.8 C)  (Oral)   Wt 89 lb 12.8 oz (40.7 kg)   SpO2 97%   BMI 17.54 kg/m   BP Readings from Last 3 Encounters:  08/28/22 (!) 98/58  04/25/22 108/66  01/31/21 106/80   Wt Readings from Last 3 Encounters:  08/28/22 89 lb 12.8 oz (40.7 kg)  04/25/22 100 lb 3.2 oz (45.5 kg)  12/12/21 94 lb (42.6 kg)      Physical Exam Constitutional:      General: She is not in acute distress.    Appearance: Normal appearance. She is ill-appearing. She is not toxic-appearing.  HENT:     Head: Normocephalic and atraumatic.     Nose: Nose normal.     Mouth/Throat:     Mouth: Mucous membranes are dry.  Eyes:     Extraocular Movements: Extraocular movements intact.     Conjunctiva/sclera: Conjunctivae normal.  Cardiovascular:     Rate and Rhythm: Normal rate and regular rhythm.     Heart sounds: Normal heart sounds.  Pulmonary:     Effort: Pulmonary effort is normal.     Breath sounds: Normal breath sounds.  Abdominal:     General: Bowel sounds are normal.     Palpations: Abdomen is soft.  Skin:    General: Skin is warm and  dry.  Neurological:     Mental Status: She is alert and oriented to person, place, and time.      Results for orders placed or performed in visit on 08/28/22  POC COVID-19  Result Value Ref Range   SARS Coronavirus 2 Ag Negative Negative      Assessment & Plan:  Viral gastritis  Nausea and vomiting, unspecified vomiting type -     POC COVID-19 BinaxNow -     Ondansetron HCl; Take 1 tablet (4 mg total) by mouth every 8 (eight) hours as needed for nausea or vomiting.  Dispense: 20 tablet; Refill: 0 -     Ondansetron HCl  Exposure to COVID-19 virus -     nirmatrelvir/ritonavir; Take 3 tablets by mouth 2 (two) times daily for 5 days. (Take nirmatrelvir 150 mg two tablets twice daily for 5 days and ritonavir 100 mg one tablet twice daily for 5 days) Patient GFR is >60  Dispense: 30 tablet; Refill: 0  Dehydration   Patient presents with acute viral gastritis symptoms x  2 days with work exposure to COVID 19 virus.  POC COVID testing in clinic negative.  Repeat testing in 2 days.  Given patient's symptoms discussed r/b/a of starting antiviral medication.  Rx for Paxlovid sent to pharmacy.  Unfortunately unable to start IV in clinic. Given Zofran 4 mg IM in clinic for nausea and vomiting.  Rx also sent to pharmacy.  Will have patient drink small sips throughout the day.  If unable to stay hydrated proceed to nearest ED.  Return if symptoms worsen or fail to improve.   Deeann Saint, MD

## 2022-11-26 ENCOUNTER — Telehealth: Payer: No Typology Code available for payment source | Admitting: Physician Assistant

## 2022-11-26 DIAGNOSIS — J039 Acute tonsillitis, unspecified: Secondary | ICD-10-CM | POA: Diagnosis not present

## 2022-11-26 MED ORDER — AMOXICILLIN 500 MG PO TABS
500.0000 mg | ORAL_TABLET | Freq: Two times a day (BID) | ORAL | 0 refills | Status: AC
Start: 2022-11-26 — End: 2022-12-06

## 2022-11-26 NOTE — Patient Instructions (Signed)
Brenda Hutchinson, thank you for joining Piedad Climes, PA-C for today's virtual visit.  While this provider is not your primary care provider (PCP), if your PCP is located in our provider database this encounter information will be shared with them immediately following your visit.   A [facility] MyChart account gives you access to today's visit and all your visits, tests, and labs performed at [facility] " click here if you don't have a [facility] MyChart account or go to [email or web address]  Consent: (Patient) Brenda Hutchinson provided verbal consent for this virtual visit at the beginning of the encounter.  Current Medications:  Current Outpatient Medications:    ELDERBERRY PO, Take by mouth daily., Disp: , Rfl:    escitalopram (LEXAPRO) 10 MG tablet, Take 1 tablet (10 mg total) by mouth daily., Disp: 90 tablet, Rfl: 1   fluticasone (FLONASE) 50 MCG/ACT nasal spray, Place 1 spray into both nostrils daily., Disp: 16 g, Rfl: 0   Multiple Vitamins-Minerals (HM MULTIVITAMIN ADULT GUMMY PO), Take by mouth., Disp: , Rfl:    ondansetron (ZOFRAN) 4 MG tablet, Take 1 tablet (4 mg total) by mouth every 8 (eight) hours as needed for nausea or vomiting., Disp: 20 tablet, Rfl: 0   Medications ordered in this encounter:  No orders of the defined types were placed in this encounter.    *If you need refills on other medications prior to your next appointment, please contact your pharmacy*  Follow-Up: Call back or seek an in-person evaluation if the symptoms worsen or if the condition fails to improve as anticipated.  [facility] [phone]  Other Instructions Tonsillitis  Tonsillitis is an infection of the throat. Tonsils are tissues in the back of your throat. This infection causes the tonsils to become red, tender, and swollen. What are the causes? Tonsillitis is caused by germs (bacteria or a virus). This condition can also occur when  pieces of food and bacteria build up around the tonsils. Tonsillitis that is caused by germs can spread from person to person. What are the signs or symptoms? A sore throat. Trouble swallowing. White patches on the tonsils. Swollen tonsils. Fever. Headache. Tiredness. Not feeling hungry. Snoring during sleep when you did not snore before. Foul-smelling, yellowish-white pieces of material that you cough up or spit out. These can cause bad breath. How is this treated? Medicines. These can be given to treat pain, swelling, or fever. They can also be given to kill bacteria. Surgery to take out the tonsils. This is done if you have very bad infections that do not go away. Follow these instructions at home: Medicines Take over-the-counter and prescription medicines only as told by your doctor. If you were prescribed an antibiotic medicine, take it as told by your doctor. Do not stop taking the antibiotic even if you start to feel better. Eating and drinking Drink enough fluid to keep your pee (urine) pale yellow. While your throat is sore, eat soft or liquid foods, such as: Soup. Sherbet. Soft, warm cereals, such as oatmeal or hot wheat cereal. Drink warm fluids. Eat frozen ice pops. General instructions Rest as much as you can, and get plenty of sleep. Rinse your mouth often with salt water. To make salt water, dissolve -1 tsp (3-6 g) of salt in 1 cup (237 mL) of warm water. Do not swallow the salt water. Wash your hands often with soap and water for at least 20 seconds. If there is no soap and water,  use hand sanitizer. Do not share cups, bottles, or other utensils until your symptoms are gone. Do not smoke or use any products that contain nicotine or tobacco. If you need help quitting, ask your doctor. Keep all follow-up visits. Contact a doctor if: You have large, tender lumps in your neck that are new. You have a fever that does not go away after 2-3 days. You have a  rash. You cough up green, yellow-brown, or bloody fluid. You cannot swallow liquids or food for 24 hours. Only one of your tonsils is swollen. Get help right away if: You have any new symptoms such as: Vomiting. Very bad headache. Stiff neck. Chest pain. Trouble breathing or swallowing. You have very bad throat pain, and you also have drooling or voice changes. You have very bad pain that is not helped by medicine. You cannot fully open your mouth. You have redness, swelling, or very bad pain anywhere in your neck. Summary Tonsillitis is an infection of the throat. It causes your tonsils to be red, tender, and swollen. While your throat is sore, eat soft or liquid foods. Rinse your mouth often with salt water. Do not share cups, bottles, or other utensils until your symptoms are gone. This information is not intended to replace advice given to you by your health care provider. Make sure you discuss any questions you have with your health care provider. Document Revised: 05/24/2020 Document Reviewed: 05/24/2020 Elsevier Patient Education  2024 Elsevier Inc.    If you have been instructed to have an in-person evaluation today at a local Urgent Care facility, please use the link below. It will take you to a list of all of our available [facility], including address, phone number and hours of operation. Please do not delay care.  [facility]  If you or a family member do not have a primary care provider, use the link below to schedule a visit and establish care. When you choose a [facility] primary care physician or advanced practice provider, you gain a long-term partner in health. Find a Primary Care Provider  Learn more about [facility]'s in-office and virtual care options: [facility] Get Care Now

## 2022-11-26 NOTE — Progress Notes (Signed)
Virtual Visit Consent   Brenda Hutchinson, you are scheduled for a virtual visit with a [facility] provider today. Just as with appointments in the office, your consent must be obtained to participate. Your consent will be active for this visit and any virtual visit you may have with one of our providers in the next 365 days. If you have a MyChart account, a copy of this consent can be sent to you electronically.  As this is a virtual visit, video technology does not allow for your provider to perform a traditional examination. This may limit your provider's ability to fully assess your condition. If your provider identifies any concerns that need to be evaluated in person or the need to arrange testing (such as labs, EKG, etc.), we will make arrangements to do so. Although advances in technology are sophisticated, we cannot ensure that it will always work on either your end or our end. If the connection with a video visit is poor, the visit may have to be switched to a telephone visit. With either a video or telephone visit, we are not always able to ensure that we have a secure connection.  By engaging in this virtual visit, you consent to the provision of healthcare and authorize for your insurance to be billed (if applicable) for the services provided during this visit. Depending on your insurance coverage, you may receive a charge related to this service.  I need to obtain your verbal consent now. Are you willing to proceed with your visit today? Brenda Hutchinson has provided verbal consent on 11/26/2022 for a virtual visit (video or telephone). Piedad Climes, [place]  Date: 11/26/2022 5:33 PM  Virtual Visit via Video Note   I, Piedad Climes, connected with  Brenda Hutchinson  ([id number], 1995/01/21) on 11/26/22 at  5:30 PM EST by a video-enabled telemedicine application and verified that I am speaking with the correct person using two  identifiers.  Location: Patient: Virtual Visit Location Patient: Home Provider: Virtual Visit Location Provider: Home Office   I discussed the limitations of evaluation and management by telemedicine and the availability of in person appointments. The patient expressed understanding and agreed to proceed.    History of Present Illness: Brenda Hutchinson is a 27 y.o. who identifies as a female who was assigned female at birth, and is being seen today for sore throat and odynophagia starting about 3-4 days ago. Notes originally thought was related to having to shout some over the weekend but continues to progress. Denies nasal congestion or cough. Denies fever, chills, sinus pain, ear pain or tooth pain. Denies chest pain or SOB. Denies recent travel or sick contact.Marland Kitchen  OTC -- Alka Seltzer Cold and Flu.  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, Disp: , Rfl:    ondansetron (ZOFRAN) 4 MG tablet, Take 1 tablet (4 mg total) by mouth every 8 (eight) hours as needed for nausea or vomiting., Disp: 20 tablet, Rfl: 0  Observations/Objective: Patient is well-developed, well-nourished in no acute  distress.  Resting comfortably at home.  Head is normocephalic, atraumatic.  No labored breathing. Speech is clear and coherent with logical content.  Patient is alert and oriented at baseline.  Substantial oropharyngeal erythema with bilateral tonsillar swelling 2-3+. No visible exudate. Uvula midline without edema or noted lesion.  Assessment and Plan: 1. Tonsillitis - amoxicillin (AMOXIL) 500 MG tablet; Take 1 tablet (500 mg total) by mouth 2 (two) times daily for 10 days.  Dispense: 20 tablet; Refill: 0  Supportive measures and OTC medications reviewed. Will start Amoxicillin per orders. Follow-up in person if not resolving or for any new/worsening symptoms despite treatment..   Follow Up Instructions: I discussed the assessment and treatment plan with the patient. The patient was provided an opportunity to ask questions and all were answered. The patient agreed with the plan and demonstrated an understanding of the instructions.  A copy of instructions were sent to the patient via MyChart unless otherwise noted below.   The patient was advised to call back or seek an in-person evaluation if the symptoms worsen or if the condition fails to improve as anticipated.    Piedad Climes, PA-C

## 2023-03-04 ENCOUNTER — Encounter: Payer: PRIVATE HEALTH INSURANCE | Admitting: Family Medicine

## 2023-03-12 ENCOUNTER — Other Ambulatory Visit (HOSPITAL_COMMUNITY)
Admission: RE | Admit: 2023-03-12 | Discharge: 2023-03-12 | Disposition: A | Discharge status: Home / Self Care | Payer: PRIVATE HEALTH INSURANCE | Source: Ambulatory Visit | Attending: Family Medicine | Admitting: Family Medicine

## 2023-03-12 ENCOUNTER — Encounter: Payer: Self-pay | Admitting: Family Medicine

## 2023-03-12 ENCOUNTER — Ambulatory Visit (INDEPENDENT_AMBULATORY_CARE_PROVIDER_SITE_OTHER): Payer: PRIVATE HEALTH INSURANCE | Admitting: Family Medicine

## 2023-03-12 VITALS — BP 98/66 | HR 74 | Temp 98.5°F | Ht 60.0 in | Wt 89.8 lb

## 2023-03-12 DIAGNOSIS — F419 Anxiety disorder, unspecified: Secondary | ICD-10-CM

## 2023-03-12 DIAGNOSIS — Z0000 Encounter for general adult medical examination without abnormal findings: Secondary | ICD-10-CM | POA: Diagnosis not present

## 2023-03-12 DIAGNOSIS — Z124 Encounter for screening for malignant neoplasm of cervix: Secondary | ICD-10-CM

## 2023-03-12 DIAGNOSIS — R630 Anorexia: Secondary | ICD-10-CM

## 2023-03-12 DIAGNOSIS — E559 Vitamin D deficiency, unspecified: Secondary | ICD-10-CM

## 2023-03-12 DIAGNOSIS — Z1159 Encounter for screening for other viral diseases: Secondary | ICD-10-CM

## 2023-03-12 DIAGNOSIS — F321 Major depressive disorder, single episode, moderate: Secondary | ICD-10-CM | POA: Diagnosis not present

## 2023-03-12 LAB — COMPREHENSIVE METABOLIC PANEL
ALT: 9 U/L (ref 0–35)
AST: 16 U/L (ref 0–37)
Albumin: 4.7 g/dL (ref 3.5–5.2)
Alkaline Phosphatase: 60 U/L (ref 39–117)
BUN: 10 mg/dL (ref 6–23)
CO2: 28 meq/L (ref 19–32)
Calcium: 9.1 mg/dL (ref 8.4–10.5)
Chloride: 103 meq/L (ref 96–112)
Creatinine, Ser: 0.65 mg/dL (ref 0.40–1.20)
GFR: 119.51 mL/min (ref >60.00)
Glucose, Bld: 83 mg/dL (ref 70–99)
Potassium: 4 meq/L (ref 3.5–5.1)
Sodium: 138 meq/L (ref 135–145)
Total Bilirubin: 0.5 mg/dL (ref 0.2–1.2)
Total Protein: 7.8 g/dL (ref 6.0–8.3)

## 2023-03-12 LAB — CBC WITH DIFFERENTIAL/PLATELET
Basophils Absolute: 0.1 10*3/uL (ref 0.0–0.1)
Basophils Relative: 1 % (ref 0.0–3.0)
Eosinophils Absolute: 0.3 10*3/uL (ref 0.0–0.7)
Eosinophils Relative: 5.7 % — ABNORMAL HIGH (ref 0.0–5.0)
HCT: 40 % (ref 36.0–46.0)
Hemoglobin: 13.4 g/dL (ref 12.0–15.0)
Lymphocytes Relative: 35.3 % (ref 12.0–46.0)
Lymphs Abs: 2 10*3/uL (ref 0.7–4.0)
MCHC: 33.6 g/dL (ref 30.0–36.0)
MCV: 92.2 fL (ref 78.0–100.0)
Monocytes Absolute: 0.4 10*3/uL (ref 0.1–1.0)
Monocytes Relative: 6.6 % (ref 3.0–12.0)
Neutro Abs: 3 10*3/uL (ref 1.4–7.7)
Neutrophils Relative %: 51.4 % (ref 43.0–77.0)
Platelets: 271 10*3/uL (ref 150.0–400.0)
RBC: 4.34 Mil/uL (ref 3.87–5.11)
RDW: 13.1 % (ref 11.5–15.5)
WBC: 5.8 10*3/uL (ref 4.0–10.5)

## 2023-03-12 LAB — LIPID PANEL
Cholesterol: 172 mg/dL (ref 0–200)
HDL: 53.4 mg/dL (ref >39.00)
LDL Cholesterol: 112 mg/dL — ABNORMAL HIGH (ref 0–99)
NonHDL: 118.7
Total CHOL/HDL Ratio: 3
Triglycerides: 35 mg/dL (ref 0.0–149.0)
VLDL: 7 mg/dL (ref 0.0–40.0)

## 2023-03-12 LAB — TSH: TSH: 1.38 u[IU]/mL (ref 0.35–5.50)

## 2023-03-12 LAB — VITAMIN D 25 HYDROXY (VIT D DEFICIENCY, FRACTURES): VITD: 15.62 ng/mL — ABNORMAL LOW (ref 30.00–100.00)

## 2023-03-12 LAB — T4, FREE: Free T4: 0.69 ng/dL (ref 0.60–1.60)

## 2023-03-12 LAB — VITAMIN B12: Vitamin B-12: 691 pg/mL (ref 211–911)

## 2023-03-12 LAB — HEMOGLOBIN A1C: Hgb A1c MFr Bld: 5.4 % (ref 4.6–6.5)

## 2023-03-12 MED ORDER — SERTRALINE HCL 25 MG PO TABS: 25.0000 mg | ORAL_TABLET | Freq: Every day | ORAL | 3 refills | Status: DC

## 2023-03-12 NOTE — Progress Notes (Unsigned)
 Established Patient Office Visit   Subjective  Patient ID: Brenda Hutchinson, female    DOB: 12-26-1995  Age: 28 y.o. MRN: [id number]  Chief Complaint  Patient presents with  . Annual Exam    Pr is a 28 yo female seen for CPE and f/u.  Pt still dealing with anxiety.  Interested in restarting meds.  Previously on lexapro.  Lexapro caused drowsiness several hrs after taking.  Pt notes less stress since changing job location.  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  In Dec pt dealt with the passing of her child's father.  Had mixed emotions as it was not the healthiest of relationships.  Their son died in 03-18-15.   Pt going for walks, praying, reading the bible, and take breaks from social for self care.   Patient Active Problem List   Diagnosis Date Noted  . Anxiety and depression 01/29/2018  . History of migraine 01/29/2018  . Hallux valgus 02/18/2017  . HGSIL on cytologic smear of cervix 12/17/2016   Past Medical History:  Diagnosis Date  . Allergy   . Anxiety   . Bunion of right foot 11/2016  . Chicken pox   . Depression   . Family history of adverse reaction to anesthesia    pt's son has hx. of being hard to wake up post-op  . Frequent headaches   . History of kidney stones   . Migraines    Past Surgical History:  Procedure Laterality Date  . BUNIONECTOMY Right 12/11/2016   Procedure: Right Lapidus and Modified Orpha Bur;  Surgeon: Toni Arthurs, MD;  Location: [facility];  Service: Orthopedics;  Laterality: Right;  . CESAREAN SECTION    . SMALL INTESTINE SURGERY     Social History   Tobacco Use  . Smoking status: Never  . Smokeless tobacco: Never  Vaping Use  . Vaping status: Never Used  Substance Use Topics  . Alcohol use: Yes    Comment: occcasionally  . Drug use: No   Family History  Problem Relation Age of Onset  . Anesthesia problems Son        hard to wake up post-op  . Early death  Son   . Depression Mother   . Diabetes Mother    No Known Allergies    ROS Negative unless stated above    Objective:     BP 98/66 (BP Location: Right Arm, Patient Position: Sitting, Cuff Size: Small)   Pulse 74   Temp 98.5 F (36.9 C) (Oral)   Ht 5' (1.524 m)   Wt 89 lb 12.8 oz (40.7 kg)   LMP 02/21/2023 (Exact Date)   SpO2 95%   BMI 17.54 kg/m  BP Readings from Last 3 Encounters:  03/12/23 98/66  08/28/22 (!) 98/58  04/25/22 108/66   Wt Readings from Last 3 Encounters:  03/12/23 89 lb 12.8 oz (40.7 kg)  08/28/22 89 lb 12.8 oz (40.7 kg)  04/25/22 100 lb 3.2 oz (45.5 kg)      Physical Exam Constitutional:      Appearance: Normal appearance.  HENT:     Head: Normocephalic and atraumatic.     Right Ear: Tympanic membrane, ear canal and external ear normal.     Left Ear: Tympanic membrane, ear canal and external ear normal.     Nose: Nose normal.     Mouth/Throat:     Mouth: Mucous membranes are moist.  Pharynx: No oropharyngeal exudate or posterior oropharyngeal erythema.  Eyes:     General: No scleral icterus.    Extraocular Movements: Extraocular movements intact.     Conjunctiva/sclera: Conjunctivae normal.     Pupils: Pupils are equal, round, and reactive to light.  Neck:     Thyroid: No thyromegaly.  Cardiovascular:     Rate and Rhythm: Normal rate and regular rhythm.     Pulses: Normal pulses.     Heart sounds: Normal heart sounds. No murmur heard.    No friction rub.  Pulmonary:     Effort: Pulmonary effort is normal.     Breath sounds: Normal breath sounds. No wheezing, rhonchi or rales.  Abdominal:     General: Bowel sounds are normal.     Palpations: Abdomen is soft.     Tenderness: There is no abdominal tenderness.  Genitourinary:    General: Normal vulva.     Exam position: Lithotomy position.     Labia:        Right: No rash or lesion.        Left: No rash or lesion.      Urethra: No urethral swelling.     Vagina: Vaginal  discharge present.     Cervix: No cervical motion tenderness, discharge, friability, lesion, erythema, cervical bleeding or eversion.     Uterus: Normal.      Rectum: Normal.     Comments: Pap collected. Musculoskeletal:        General: No deformity. Normal range of motion.  Lymphadenopathy:     Cervical: No cervical adenopathy.  Skin:    General: Skin is warm and dry.     Findings: No lesion.  Neurological:     General: No focal deficit present.     Mental Status: She is alert and oriented to person, place, and time.  Psychiatric:        Mood and Affect: Mood normal.        Thought Content: Thought content normal.      12/12/2021   12:58 PM 01/31/2021    3:15 PM  Depression screen PHQ 2/9  Decreased Interest 0 3  Down, Depressed, Hopeless 0 2  PHQ - 2 Score 0 5  Altered sleeping 3 2  Tired, decreased energy 0 2  Change in appetite 3 2  Feeling bad or failure about yourself  0 2  Trouble concentrating 0 1  Moving slowly or fidgety/restless 0 0  Suicidal thoughts 0 0  PHQ-9 Score 6 14  Difficult doing work/chores Not difficult at all       12/12/2021    1:17 PM 01/31/2021    3:15 PM  GAD 7 : Generalized Anxiety Score  Nervous, Anxious, on Edge 1 3  Control/stop worrying 0 2  Worry too much - different things 1 2  Trouble relaxing 0 2  Restless 0 0  Easily annoyed or irritable 1 3  Afraid - awful might happen 0 1  Total GAD 7 Score 3 13  Anxiety Difficulty  Very difficult    No results found for any visits on 03/12/23.    Assessment & Plan:  Well adult exam -Age-appropriate health screenings discussed -Obtain labs -Pap done this visit. -Immunizations reviewed. -Next CPE in 1 year -     CBC with Differential/Platelet; Future -     Comprehensive metabolic panel; Future -     Hemoglobin A1c; Future -     Lipid panel; Future  Anxiety -GAD-7 score 3 -  start zoloft 25 mg daily -continue counseling and self care. -     TSH; Future -     Sertraline HCl;  Take 1 tablet (25 mg total) by mouth daily.  Dispense: 30 tablet; Refill: 3 -     T4, free; Future  Depression, major, single episode, moderate (HCC) -PHQ 9 score 6 -start zoloft 25 mg daily -continue counseling and self care. -     Sertraline HCl; Take 1 tablet (25 mg total) by mouth daily.  Dispense: 30 tablet; Refill: 3  Need for hepatitis C screening test -     Hepatitis C antibody  Decreased appetite -advised to d/c MJ use as may be contributing. -     Comprehensive metabolic panel; Future -     TSH; Future -     Vitamin B12; Future -     VITAMIN D 25 Hydroxy (Vit-D Deficiency, Fractures); Future -     T4, free; Future  Cervical cancer screening -     Cytology - PAP  Return in about 5 weeks (around 04/16/2023).   Deeann Saint, MD

## 2023-03-13 ENCOUNTER — Other Ambulatory Visit: Payer: Self-pay

## 2023-03-13 ENCOUNTER — Encounter: Payer: Self-pay | Admitting: Family Medicine

## 2023-03-13 DIAGNOSIS — E559 Vitamin D deficiency, unspecified: Secondary | ICD-10-CM

## 2023-03-13 LAB — HEPATITIS C ANTIBODY: Hepatitis C Ab: NONREACTIVE

## 2023-03-13 MED ORDER — VITAMIN D (ERGOCALCIFEROL) 1.25 MG (50000 UNIT) PO CAPS: 50000.0000 [IU] | ORAL_CAPSULE | ORAL | 0 refills | Status: AC

## 2023-03-17 LAB — CYTOLOGY - PAP
Chlamydia: NEGATIVE
Comment: NEGATIVE
Comment: NEGATIVE
Comment: NORMAL
Diagnosis: NEGATIVE
Diagnosis: REACTIVE
Neisseria Gonorrhea: NEGATIVE
Trichomonas: NEGATIVE

## 2023-03-19 ENCOUNTER — Other Ambulatory Visit: Payer: Self-pay | Admitting: Family Medicine

## 2023-03-19 DIAGNOSIS — B9689 Other specified bacterial agents as the cause of diseases classified elsewhere: Secondary | ICD-10-CM

## 2023-03-19 MED ORDER — METRONIDAZOLE 500 MG PO TABS: 500.0000 mg | ORAL_TABLET | Freq: Two times a day (BID) | ORAL | 0 refills | Status: AC

## 2023-04-23 ENCOUNTER — Encounter: Payer: Self-pay | Admitting: Family Medicine

## 2023-04-23 ENCOUNTER — Telehealth (INDEPENDENT_AMBULATORY_CARE_PROVIDER_SITE_OTHER): Payer: PRIVATE HEALTH INSURANCE | Admitting: Family Medicine

## 2023-04-23 ENCOUNTER — Ambulatory Visit: Payer: PRIVATE HEALTH INSURANCE | Admitting: Family Medicine

## 2023-04-23 DIAGNOSIS — F419 Anxiety disorder, unspecified: Secondary | ICD-10-CM

## 2023-04-23 DIAGNOSIS — F321 Major depressive disorder, single episode, moderate: Secondary | ICD-10-CM

## 2023-04-23 MED ORDER — SERTRALINE HCL 25 MG PO TABS: 25.0000 mg | ORAL_TABLET | Freq: Every day | ORAL | 1 refills | Status: AC

## 2023-04-23 NOTE — Progress Notes (Signed)
 Patient was unable to self-report due to a lack of equipment at home via telehealth

## 2023-04-23 NOTE — Progress Notes (Signed)
 Virtual Visit via Video Note  I connected with Brenda Hutchinson on 04/23/23 at 10:00 AM EDT by a video enabled telemedicine application and verified that I am speaking with the correct person using two identifiers.  Location patient: home Location provider:work or home office Persons participating in the virtual visit: patient, provider  I discussed the limitations of evaluation and management by telemedicine and the availability of in person appointments. The patient expressed understanding and agreed to proceed.  Chief Complaint  Patient presents with   Follow-up    Medication Zoloft     HPI: Patient is a 28 year old female seen for follow-up on anxiety and depression.  Pt states the change from lexapro to Zoloft has been helpful.  Pt has more energy, getting out more, enjoying the fresh air/getting outside more.  Sleep is great.  Goes to bed around 9pm and is up by 7am.   Pt is also eating more consistently. States he mom noticed the change in her appetite and overall mood.  Pt has refills on med, but was not sure if she needed a new rx as her insurance requires she use [facility] pharmacies.  ROS: See pertinent positives and negatives per HPI.  Past Medical History:  Diagnosis Date   Allergy    Anxiety    Bunion of right foot 11/2016   Chicken pox    Depression    Family history of adverse reaction to anesthesia    pt's son has hx. of being hard to wake up post-op   Frequent headaches    History of kidney stones    Migraines     Past Surgical History:  Procedure Laterality Date   BUNIONECTOMY Right 12/11/2016   Procedure: Right Lapidus and Modified Brenda Hutchinson;  Surgeon: Brenda Arthurs, MD;  Location: [facility];  Service: Orthopedics;  Laterality: Right;   CESAREAN SECTION     SMALL INTESTINE SURGERY      Family History  Problem Relation Age of Onset   Anesthesia problems Son        hard to wake up post-op   Early death Son    Depression  Mother    Diabetes Mother      Current Outpatient Medications:    ELDERBERRY PO, Take by mouth daily., Disp: , Rfl:    fluticasone (FLONASE) 50 MCG/ACT nasal spray, Place 1 spray into both nostrils daily., Disp: 16 g, Rfl: 0   Multiple Vitamins-Minerals (HM MULTIVITAMIN ADULT GUMMY PO), Take by mouth., Disp: , Rfl:    ondansetron (ZOFRAN) 4 MG tablet, Take 1 tablet (4 mg total) by mouth every 8 (eight) hours as needed for nausea or vomiting., Disp: 20 tablet, Rfl: 0   sertraline (ZOLOFT) 25 MG tablet, Take 1 tablet (25 mg total) by mouth daily., Disp: 30 tablet, Rfl: 3   Vitamin D, Ergocalciferol, (DRISDOL) 1.25 MG (50000 UNIT) CAPS capsule, Take 1 capsule (50,000 Units total) by mouth every 7 (seven) days., Disp: 12 capsule, Rfl: 0  EXAM:  VITALS per patient if applicable: RR between 12-20 bpm  GENERAL: alert, oriented, appears well and in no acute distress.  Patient seems brighter/happier.  HEENT: atraumatic, conjunctiva clear, no obvious abnormalities on inspection of external nose and ears  NECK: normal movements of the head and neck  LUNGS: on inspection no signs of respiratory distress, breathing rate appears normal, no obvious gross SOB, gasping or wheezing  CV: no obvious cyanosis  MS: moves all visible extremities without noticeable abnormality  PSYCH/NEURO: pleasant and  cooperative, no obvious depression or anxiety, speech and thought processing grossly intact    04/23/2023    9:51 AM 03/12/2023    1:26 PM 12/12/2021   12:58 PM  Depression screen PHQ 2/9  Decreased Interest 0 0 0  Down, Depressed, Hopeless 0 0 0  PHQ - 2 Score 0 0 0  Altered sleeping 0 0 3  Tired, decreased energy 0 1 0  Change in appetite 1 3 3   Feeling bad or failure about yourself  0 0 0  Trouble concentrating 0 0 0  Moving slowly or fidgety/restless 0 0 0  Suicidal thoughts 0 0 0  PHQ-9 Score 1 4 6   Difficult doing work/chores  Not difficult at all Not difficult at all      04/23/2023     9:52 AM 03/12/2023    1:26 PM 12/12/2021    1:17 PM 01/31/2021    3:15 PM  GAD 7 : Generalized Anxiety Score  Nervous, Anxious, on Edge 1 2 1 3   Control/stop worrying 0 1 0 2  Worry too much - different things 1 3 1 2   Trouble relaxing 0 1 0 2  Restless 0 0 0 0  Easily annoyed or irritable 1 1 1 3   Afraid - awful might happen 0 2 0 1  Total GAD 7 Score 3 10 3 13   Anxiety Difficulty  Somewhat difficult  Very difficult     ASSESSMENT AND PLAN:  Discussed the following assessment and plan:  Anxiety - Plan: sertraline (ZOLOFT) 25 MG tablet  Depression, major, single episode, moderate (HCC) - Plan: sertraline (ZOLOFT) 25 MG tablet  Improvement in anxiety and depression symptoms since starting Zoloft.  PHQ-9 score 1, previously 4.  GAD-7 score 3 previously 10.  Continue Zoloft 25 mg daily.  New Rx sent to [facility] pharmacy in [facility] due to insurance coverage.  Continue counseling and self-care.  Follow-up in 3-6 months, sooner if needed.   I discussed the assessment and treatment plan with the patient. The patient was provided an opportunity to ask questions and all were answered. The patient agreed with the plan and demonstrated an understanding of the instructions.   The patient was advised to call back or seek an in-person evaluation if the symptoms worsen or if the condition fails to improve as anticipated.   Brenda Saint, MD
